# Patient Record
Sex: Male | Born: 1965 | Race: Black or African American | Hispanic: No | Marital: Single | Smoking: Former smoker
Health system: Southern US, Community
[De-identification: ages and names within clinical notes are randomized; demographics above are authoritative.]

## PROBLEM LIST (undated history)

## (undated) DIAGNOSIS — I509 Heart failure, unspecified: Secondary | ICD-10-CM

## (undated) DIAGNOSIS — G4730 Sleep apnea, unspecified: Secondary | ICD-10-CM

## (undated) HISTORY — PX: THYROID SURGERY: SHX805

## (undated) HISTORY — DX: Sleep apnea, unspecified: G47.30

## (undated) HISTORY — PX: HERNIA REPAIR: SHX51

## (undated) HISTORY — PX: HEAD & NECK WOUND REPAIR / CLOSURE: SUR1142

---
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
## 2014-01-23 DIAGNOSIS — I509 Heart failure, unspecified: Secondary | ICD-10-CM

## 2014-01-23 HISTORY — DX: Heart failure, unspecified: I50.9

## 2019-03-24 DIAGNOSIS — K859 Acute pancreatitis without necrosis or infection, unspecified: Secondary | ICD-10-CM

## 2019-03-24 HISTORY — DX: Acute pancreatitis without necrosis or infection, unspecified: K85.90

## 2019-04-03 ENCOUNTER — Inpatient Hospital Stay (HOSPITAL_COMMUNITY)
Admission: EM | Admit: 2019-04-03 | Discharge: 2019-04-10 | DRG: 439 | Disposition: A | Discharge status: Home / Self Care | Payer: BLUE CROSS/BLUE SHIELD | Attending: Internal Medicine | Admitting: Internal Medicine

## 2019-04-03 ENCOUNTER — Inpatient Hospital Stay (HOSPITAL_COMMUNITY): Payer: BLUE CROSS/BLUE SHIELD

## 2019-04-03 ENCOUNTER — Encounter (HOSPITAL_COMMUNITY): Payer: Self-pay

## 2019-04-03 ENCOUNTER — Emergency Department (HOSPITAL_COMMUNITY): Payer: BLUE CROSS/BLUE SHIELD

## 2019-04-03 ENCOUNTER — Other Ambulatory Visit: Payer: Self-pay

## 2019-04-03 DIAGNOSIS — K7460 Unspecified cirrhosis of liver: Secondary | ICD-10-CM | POA: Diagnosis present

## 2019-04-03 DIAGNOSIS — N182 Chronic kidney disease, stage 2 (mild): Secondary | ICD-10-CM | POA: Diagnosis present

## 2019-04-03 DIAGNOSIS — N179 Acute kidney failure, unspecified: Secondary | ICD-10-CM | POA: Diagnosis present

## 2019-04-03 DIAGNOSIS — I440 Atrioventricular block, first degree: Secondary | ICD-10-CM | POA: Diagnosis present

## 2019-04-03 DIAGNOSIS — E669 Obesity, unspecified: Secondary | ICD-10-CM

## 2019-04-03 DIAGNOSIS — I10 Essential (primary) hypertension: Secondary | ICD-10-CM | POA: Diagnosis not present

## 2019-04-03 DIAGNOSIS — K219 Gastro-esophageal reflux disease without esophagitis: Secondary | ICD-10-CM | POA: Diagnosis present

## 2019-04-03 DIAGNOSIS — I452 Bifascicular block: Secondary | ICD-10-CM | POA: Diagnosis present

## 2019-04-03 DIAGNOSIS — N1330 Unspecified hydronephrosis: Secondary | ICD-10-CM | POA: Diagnosis present

## 2019-04-03 DIAGNOSIS — I5032 Chronic diastolic (congestive) heart failure: Secondary | ICD-10-CM | POA: Diagnosis present

## 2019-04-03 DIAGNOSIS — Z87891 Personal history of nicotine dependence: Secondary | ICD-10-CM

## 2019-04-03 DIAGNOSIS — R0600 Dyspnea, unspecified: Secondary | ICD-10-CM

## 2019-04-03 DIAGNOSIS — K859 Acute pancreatitis without necrosis or infection, unspecified: Secondary | ICD-10-CM | POA: Diagnosis not present

## 2019-04-03 DIAGNOSIS — Z20822 Contact with and (suspected) exposure to covid-19: Secondary | ICD-10-CM | POA: Diagnosis present

## 2019-04-03 DIAGNOSIS — Z6832 Body mass index (BMI) 32.0-32.9, adult: Secondary | ICD-10-CM | POA: Diagnosis not present

## 2019-04-03 DIAGNOSIS — K567 Ileus, unspecified: Secondary | ICD-10-CM | POA: Diagnosis not present

## 2019-04-03 DIAGNOSIS — K831 Obstruction of bile duct: Secondary | ICD-10-CM | POA: Insufficient documentation

## 2019-04-03 DIAGNOSIS — R1011 Right upper quadrant pain: Secondary | ICD-10-CM | POA: Diagnosis present

## 2019-04-03 DIAGNOSIS — I130 Hypertensive heart and chronic kidney disease with heart failure and stage 1 through stage 4 chronic kidney disease, or unspecified chronic kidney disease: Secondary | ICD-10-CM | POA: Diagnosis present

## 2019-04-03 DIAGNOSIS — K851 Biliary acute pancreatitis without necrosis or infection: Secondary | ICD-10-CM | POA: Diagnosis not present

## 2019-04-03 DIAGNOSIS — R140 Abdominal distension (gaseous): Secondary | ICD-10-CM

## 2019-04-03 LAB — COMPREHENSIVE METABOLIC PANEL
ALT: 920 U/L — ABNORMAL HIGH (ref 0–44)
AST: 1168 U/L — ABNORMAL HIGH (ref 15–41)
Albumin: 4.1 g/dL (ref 3.5–5.0)
Alkaline Phosphatase: 384 U/L — ABNORMAL HIGH (ref 38–126)
Anion gap: 11 (ref 5–15)
BUN: 13 mg/dL (ref 6–20)
CO2: 26 mmol/L (ref 22–32)
Calcium: 10.1 mg/dL (ref 8.9–10.3)
Chloride: 103 mmol/L (ref 98–111)
Creatinine, Ser: 1.81 mg/dL — ABNORMAL HIGH (ref 0.61–1.24)
GFR calc Af Amer: 48 mL/min — ABNORMAL LOW (ref >60)
GFR calc non Af Amer: 42 mL/min — ABNORMAL LOW (ref >60)
Glucose, Bld: 174 mg/dL — ABNORMAL HIGH (ref 70–99)
Potassium: 4 mmol/L (ref 3.5–5.1)
Sodium: 140 mmol/L (ref 135–145)
Total Bilirubin: 3.8 mg/dL — ABNORMAL HIGH (ref 0.3–1.2)
Total Protein: 8.3 g/dL — ABNORMAL HIGH (ref 6.5–8.1)

## 2019-04-03 LAB — URINALYSIS, ROUTINE W REFLEX MICROSCOPIC
Bacteria, UA: NONE SEEN
Bilirubin Urine: NEGATIVE
Glucose, UA: NEGATIVE mg/dL
Hgb urine dipstick: NEGATIVE
Ketones, ur: NEGATIVE mg/dL
Leukocytes,Ua: NEGATIVE
Nitrite: NEGATIVE
Protein, ur: 30 mg/dL — AB
Specific Gravity, Urine: 1.01 (ref 1.005–1.030)
pH: 6 (ref 5.0–8.0)

## 2019-04-03 LAB — CBC
HCT: 52.8 % — ABNORMAL HIGH (ref 39.0–52.0)
Hemoglobin: 17.4 g/dL — ABNORMAL HIGH (ref 13.0–17.0)
MCH: 31.1 pg (ref 26.0–34.0)
MCHC: 33 g/dL (ref 30.0–36.0)
MCV: 94.3 fL (ref 80.0–100.0)
Platelets: 244 10*3/uL (ref 150–400)
RBC: 5.6 MIL/uL (ref 4.22–5.81)
RDW: 13.4 % (ref 11.5–15.5)
WBC: 9.7 10*3/uL (ref 4.0–10.5)
nRBC: 0 % (ref 0.0–0.2)

## 2019-04-03 LAB — LIPASE, BLOOD: Lipase: 6430 U/L — ABNORMAL HIGH (ref 11–51)

## 2019-04-03 LAB — HEPATITIS A ANTIBODY, IGM: Hep A IgM: NONREACTIVE

## 2019-04-03 LAB — ETHANOL: Alcohol, Ethyl (B): <10 mg/dL (ref <10)

## 2019-04-03 LAB — HIV ANTIBODY (ROUTINE TESTING W REFLEX): HIV Screen 4th Generation wRfx: NONREACTIVE

## 2019-04-03 LAB — HEPATITIS PANEL, ACUTE
HCV Ab: NONREACTIVE
Hep A IgM: NONREACTIVE
Hep B C IgM: NONREACTIVE
Hepatitis B Surface Ag: NONREACTIVE

## 2019-04-03 LAB — SARS CORONAVIRUS 2 (TAT 6-24 HRS): SARS Coronavirus 2: NEGATIVE

## 2019-04-03 LAB — GAMMA GT: GGT: 1145 U/L — ABNORMAL HIGH (ref 7–50)

## 2019-04-03 LAB — HEPATITIS B SURFACE ANTIGEN: Hepatitis B Surface Ag: NONREACTIVE

## 2019-04-03 LAB — PROTIME-INR
INR: 1 (ref 0.8–1.2)
Prothrombin Time: 13.1 seconds (ref 11.4–15.2)

## 2019-04-03 LAB — HEPATITIS B CORE ANTIBODY, IGM: Hep B C IgM: NONREACTIVE

## 2019-04-03 LAB — TROPONIN I (HIGH SENSITIVITY): Troponin I (High Sensitivity): 6 ng/L (ref <18)

## 2019-04-03 LAB — FERRITIN: Ferritin: 260 ng/mL (ref 24–336)

## 2019-04-03 LAB — ACETAMINOPHEN LEVEL: Acetaminophen (Tylenol), Serum: <10 ug/mL — ABNORMAL LOW (ref 10–30)

## 2019-04-03 LAB — LACTATE DEHYDROGENASE: LDH: 748 U/L — ABNORMAL HIGH (ref 98–192)

## 2019-04-03 MED ORDER — SODIUM CHLORIDE 0.9 % IV BOLUS
1000.0000 mL | Freq: Once | INTRAVENOUS | Status: AC
  Administered 2019-04-03: 1000 mL via INTRAVENOUS

## 2019-04-03 MED ORDER — SODIUM CHLORIDE 0.9 % IV BOLUS: 500.0000 mL | Freq: Once | INTRAVENOUS | Status: DC

## 2019-04-03 MED ORDER — PIPERACILLIN-TAZOBACTAM 3.375 G IVPB
3.3750 g | Freq: Three times a day (TID) | INTRAVENOUS | Status: DC
  Administered 2019-04-04: 3.375 g via INTRAVENOUS
  Filled 2019-04-03: qty 50

## 2019-04-03 MED ORDER — ACETAMINOPHEN 325 MG PO TABS: 650.0000 mg | ORAL_TABLET | Freq: Four times a day (QID) | ORAL | Status: DC | PRN

## 2019-04-03 MED ORDER — ACETAMINOPHEN 650 MG RE SUPP: 650.0000 mg | Freq: Four times a day (QID) | RECTAL | Status: DC | PRN

## 2019-04-03 MED ORDER — LACTATED RINGERS IV BOLUS
1000.0000 mL | Freq: Once | INTRAVENOUS | Status: AC
  Administered 2019-04-03 (×2): 1000 mL via INTRAVENOUS

## 2019-04-03 MED ORDER — ONDANSETRON 4 MG PO TBDP
4.0000 mg | ORAL_TABLET | Freq: Once | ORAL | Status: AC | PRN
  Administered 2019-04-03: 4 mg via ORAL
  Filled 2019-04-03: qty 1

## 2019-04-03 MED ORDER — PIPERACILLIN-TAZOBACTAM 3.375 G IVPB 30 MIN
3.3750 g | Freq: Once | INTRAVENOUS | Status: AC
  Administered 2019-04-03: 3.375 g via INTRAVENOUS
  Filled 2019-04-03: qty 50

## 2019-04-03 MED ORDER — HYDROMORPHONE HCL 1 MG/ML IJ SOLN
0.5000 mg | INTRAMUSCULAR | Status: DC | PRN
  Administered 2019-04-03 – 2019-04-05 (×10): 0.5 mg via INTRAVENOUS
  Filled 2019-04-03 (×12): qty 1

## 2019-04-03 MED ORDER — LABETALOL HCL 5 MG/ML IV SOLN
10.0000 mg | INTRAVENOUS | Status: DC | PRN
  Filled 2019-04-03: qty 4

## 2019-04-03 MED ORDER — ONDANSETRON HCL 4 MG/2ML IJ SOLN
4.0000 mg | Freq: Once | INTRAMUSCULAR | Status: AC
  Administered 2019-04-03: 4 mg via INTRAVENOUS
  Filled 2019-04-03: qty 2

## 2019-04-03 MED ORDER — MORPHINE SULFATE (PF) 4 MG/ML IV SOLN
4.0000 mg | Freq: Once | INTRAVENOUS | Status: AC
  Administered 2019-04-03: 4 mg via INTRAVENOUS
  Filled 2019-04-03: qty 1

## 2019-04-03 MED ORDER — ONDANSETRON 4 MG PO TBDP: 8.0000 mg | ORAL_TABLET | Freq: Once | ORAL | Status: DC

## 2019-04-03 MED ORDER — ONDANSETRON HCL 4 MG PO TABS: 4.0000 mg | ORAL_TABLET | Freq: Four times a day (QID) | ORAL | Status: DC | PRN

## 2019-04-03 MED ORDER — PIPERACILLIN-TAZOBACTAM 3.375 G IVPB 30 MIN: 3.3750 g | Freq: Once | INTRAVENOUS | Status: DC

## 2019-04-03 MED ORDER — FAMOTIDINE 20 MG PO TABS: 20.0000 mg | ORAL_TABLET | Freq: Two times a day (BID) | ORAL | Status: DC | PRN

## 2019-04-03 MED ORDER — ONDANSETRON HCL 4 MG/2ML IJ SOLN: 4.0000 mg | Freq: Four times a day (QID) | INTRAMUSCULAR | Status: DC | PRN

## 2019-04-03 MED ORDER — LACTATED RINGERS IV BOLUS
1000.0000 mL | Freq: Once | INTRAVENOUS | Status: AC
  Administered 2019-04-03: 1000 mL via INTRAVENOUS

## 2019-04-03 MED ORDER — SODIUM CHLORIDE 0.9 % IV SOLN: INTRAVENOUS | Status: DC

## 2019-04-03 MED ORDER — PANTOPRAZOLE SODIUM 40 MG IV SOLR
40.0000 mg | Freq: Two times a day (BID) | INTRAVENOUS | Status: DC
  Administered 2019-04-03 – 2019-04-07 (×8): 40 mg via INTRAVENOUS
  Filled 2019-04-03 (×9): qty 40

## 2019-04-03 MED ORDER — LORAZEPAM 0.5 MG PO TABS
0.5000 mg | ORAL_TABLET | Freq: Four times a day (QID) | ORAL | Status: DC | PRN
  Administered 2019-04-03: 0.5 mg via ORAL
  Filled 2019-04-03: qty 1

## 2019-04-03 NOTE — ED Notes (Signed)
Pt transported to MRI 

## 2019-04-03 NOTE — H&P (Addendum)
History and Physical    Todd Buchanan JSE:[id number] DOB: 08-31-1965 DOA: 04/03/2019  PCP: Patient, No Pcp Per   Patient coming from: Home  I have personally briefly reviewed patient's old medical records in [place]  Chief Complaint: Abd pain  HPI: Todd Buchanan is a 54 y.o. male with medical history significant of remote Hx of CHF (no longer needing medications) presented with on and off abdominal pain.   Symptoms started about 2 months ago, associated with meals, time also had pain at night.   Denied any feeling of nauseous vomiting, diarrhea, no weight loss.   Given to see a local GI specialist in [facility], symptomatic gallstone was suspected, and he was told to return the hospital if he had any worsening of his symptoms.  Last night patient had a severe episode of abdominal pain, cramping like a 10 out of 10, with 1 episode of NBNB vomiting.  Then he has vomited multiple times over night and this morning.    Also mentioned that his stool color has been lighter since last month, and urine color has turned darker.  He started to feel episode of chills this morning.  Denied any chest pain, no short of breath.    ED Course: AST 1168, ALT 920, Alk phos 384, tot bili 3.8 as well as elevated creatinine to 1.8, RUQ U/S showed no significant obstruction or gallstones.  Review of Systems: As per HPI otherwise 10 point review of systems negative.    Past Medical History:  Diagnosis Date  . CHF (congestive heart failure) ([place])    reports lost weight and resolved and was taken off meds    History reviewed. No pertinent surgical history.   reports that he has quit smoking. He has a 10.00 pack-year smoking history. He does not have any smokeless tobacco history on file. He reports previous alcohol use. He reports previous drug use. Drugs: Cocaine and Marijuana.  No Known Allergies  No family history on file.   Prior to Admission medications   Medication  Sig Start Date End Date Taking? Authorizing Provider  Ascorbic Acid (VITAMIN C PO) Take 1 tablet by mouth daily.   Yes [provider]  Famotidine (PEPCID PO) Take 1 tablet by mouth 2 (two) times daily as needed (heartburn).   Yes [provider]    Physical Exam: Vitals:   04/03/19 1234 04/03/19 1400 04/03/19 1430 04/03/19 1445  BP: (!) 149/112 (!) 168/113 (!) 175/111 (!) 183/119  Pulse: 87 92 87 85  Resp: (!) 24 (!) 22 (!) 23 (!) 23  Temp:      TempSrc:      SpO2: 94% 90% 92% 92%  Weight:      Height:        Constitutional: NAD, calm, comfortable Vitals:   04/03/19 1234 04/03/19 1400 04/03/19 1430 04/03/19 1445  BP: (!) 149/112 (!) 168/113 (!) 175/111 (!) 183/119  Pulse: 87 92 87 85  Resp: (!) 24 (!) 22 (!) 23 (!) 23  Temp:      TempSrc:      SpO2: 94% 90% 92% 92%  Weight:      Height:       Eyes: PERRL, positive for jaundice ENMT: Mucous membranes are dry. Posterior pharynx clear of any exudate or lesions.Normal dentition.  Neck: normal, supple, no masses, no thyromegaly Respiratory: clear to auscultation bilaterally, no wheezing, no crackles. Normal respiratory effort. No accessory muscle use.  Cardiovascular: Regular rate and rhythm, no murmurs /  rubs / gallops. No extremity edema. 2+ pedal pulses. No carotid bruits.  Abdomen: RUQ tenderness, no rebound no guarding, Murphy sign negative. Musculoskeletal: no clubbing / cyanosis. No joint deformity upper and lower extremities. Good ROM, no contractures. Normal muscle tone.  Skin: no rashes, lesions, ulcers. No induration Neurologic: CN 2-12 grossly intact. Sensation intact, DTR normal. Strength 5/5 in all 4.  Psychiatric: Normal judgment and insight. Alert and oriented x 3. Normal mood.     Labs on Admission: I have personally reviewed following labs and imaging studies  CBC: Recent Labs  Lab 04/03/19 1041  WBC 9.7  HGB 17.4*  HCT 52.8*  MCV 94.3  PLT 818   Basic Metabolic Panel: Recent  Labs  Lab 04/03/19 1041  NA 140  K 4.0  CL 103  CO2 26  GLUCOSE 174*  BUN 13  CREATININE 1.81*  CALCIUM 10.1   GFR: Estimated Creatinine Clearance: 56.3 mL/min (A) (by C-G formula based on SCr of 1.81 mg/dL (H)). Liver Function Tests: Recent Labs  Lab 04/03/19 1041  AST 1,168*  ALT 920*  ALKPHOS 384*  BILITOT 3.8*  PROT 8.3*  ALBUMIN 4.1   Recent Labs  Lab 04/03/19 1041  LIPASE 6,430*   No results for input(s): AMMONIA in the last 168 hours. Coagulation Profile: No results for input(s): INR, PROTIME in the last 168 hours. Cardiac Enzymes: No results for input(s): CKTOTAL, CKMB, CKMBINDEX, TROPONINI in the last 168 hours. BNP (last 3 results) No results for input(s): PROBNP in the last 8760 hours. HbA1C: No results for input(s): HGBA1C in the last 72 hours. CBG: No results for input(s): GLUCAP in the last 168 hours. Lipid Profile: No results for input(s): CHOL, HDL, LDLCALC, TRIG, CHOLHDL, LDLDIRECT in the last 72 hours. Thyroid Function Tests: No results for input(s): TSH, T4TOTAL, FREET4, T3FREE, THYROIDAB in the last 72 hours. Anemia Panel: No results for input(s): VITAMINB12, FOLATE, FERRITIN, TIBC, IRON, RETICCTPCT in the last 72 hours. Urine analysis:    Component Value Date/Time   COLORURINE AMBER (A) 04/03/2019 1322   APPEARANCEUR CLEAR 04/03/2019 1322   LABSPEC 1.010 04/03/2019 1322   PHURINE 6.0 04/03/2019 1322   GLUCOSEU NEGATIVE 04/03/2019 1322   HGBUR NEGATIVE 04/03/2019 1322   BILIRUBINUR NEGATIVE 04/03/2019 1322   KETONESUR NEGATIVE 04/03/2019 1322   PROTEINUR 30 (A) 04/03/2019 1322   NITRITE NEGATIVE 04/03/2019 1322   LEUKOCYTESUR NEGATIVE 04/03/2019 1322    Radiological Exams on Admission: US Abdomen Complete  Result Date: 04/03/2019 CLINICAL DATA:  Elevated liver function tests, elevated lipase, abdominal pain. EXAM: ABDOMEN ULTRASOUND COMPLETE COMPARISON:  None. FINDINGS: Gallbladder: Decompressed. No gallstones or wall thickening  visualized. No sonographic Murphy sign noted by sonographer. Common bile duct: Diameter: 7 mm, upper normal for age. Liver: No focal lesion identified. Coarse echotexture. Portal vein is patent on color Doppler imaging with normal direction of blood flow towards the liver. IVC: Not visualized. Pancreas: Not visualized due to overlying bowel gas. Spleen: Size and appearance within normal limits. Right Kidney: Length: 9.9 cm. Thin cortex. Echogenicity within normal limits. No mass or hydronephrosis visualized. Left Kidney: Length: 11.7 cm. Thin cortex. Echogenicity within normal limits. Mild hydronephrosis. No mass visualized. Abdominal aorta: Not visualized due to overlying bowel gas. Other findings: None. Study limited by body habitus and a significant amount of bowel gas. IMPRESSION: 1. No gallstones identified.  No evidence of acute cholecystitis. 2. Liver with coarse echotexture raising the possibility of underlying cirrhosis. No focal liver lesion identified. 3. Common bile duct  upper limits of normal in caliber at 7 mm diameter. No intrahepatic bile duct dilatation appreciated. 4. Mild LEFT renal hydronephrosis, of uncertain chronicity. 5. Both kidneys with thin cortices suggesting some degree of chronic medical renal disease and/or renal artery atherosclerosis. 6. Study limited by body habitus and a significant amount of bowel gas. Pancreas and abdominal aorta not visualized due to overlying bowel gas. Electronically Signed   By: Franki Cabot M.D.   On: 04/03/2019 15:58    EKG: Independently reviewed.   Assessment/Plan Active Problems:   Pancreatitis due to biliary obstruction   Acute pancreatitis  Acute pancreatitis No gallstones identified, discussed with GI attending Dr. Megan Salon, MRCP ordered Pain meds and IVF, NPO Due to AKI, CT abd not done. May need ERCP D/W on call Surgeon Dr. Redmond Pulling, who plans to see the patient tonight.  Probably early cholangitis Given fast deterioration of his  symptoms, will cover with Zosyn for now.  Tansaminitis GI plans for further workup, he only has a remote Hx of EtOH use Will send AFP too  AKI vs CKD Weekly patient has severe dehydration, with hemoconcentration, will give IV fluid, and received a total of 3 L IV boluses in the ED.  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   DVT prophylaxis: SCD Code Status: Full Family Communication: None Disposition Plan: May need surgical chole before discharge Consults called: Dr. Megan Salon from GI Admission status: Med surg   Lequita Halt MD [facility] Pager [phone]    04/03/2019, 5:03 PM

## 2019-04-03 NOTE — ED Triage Notes (Signed)
Pt reports 2 weeks of RUQ pain, saw a GI specialist in [place] 2 weeks ago who thinks it is related to his gallbladder. Pt reports 2 episodes of emesis this morning. Pt a.o, nad noted.

## 2019-04-03 NOTE — ED Provider Notes (Addendum)
[facility] EMERGENCY DEPARTMENT Provider Note   CSN: [id number] Arrival date & time: 04/03/19  1007    History Chief Complaint  Patient presents with  . Abdominal Pain  . Emesis    Todd Buchanan is a 54 y.o. male with PMH CHF (no longer needing medications) here with abdominal pain and vomiting. Patient reports that 2 months ago he reported to his doctor with intermittent RUQ pain.  He was evaluated by a GI specialist in [place] who believed that this was due to his gallbladder. An outpatient RUQ [place] was ordered but was not scheduled until 3/17 and he was told to return the hospital if he had any worsening of his symptoms.  Last night patient had acutely worsening abdominal pain that spread from his right upper quadrant to his epigastrium and into his right lower quadrant.  He also developed NBNB vomiting. States he has vomited multiple times over night and this morning.  He denies any new fevers, chills, headache, vision changes, dizziness. He denies any blood in stool, dark stools, diarrhea.   Patient reports that he last drank about 20 years ago which was when he also did some cocaine, marijuana and smoked cigarettes, but he has never done IV drugs and stopped doing all those things 20 years ago. He states he was previously told he had CHF ad was on many medications, but he lost a large amount of weight and was taken off his medications. He does not currently have any medications he takes regularly at home.   Past Medical History:  Diagnosis Date  . CHF (congestive heart failure) ([place])    reports lost weight and resolved and was taken off meds    There are no problems to display for this patient.   History reviewed. No pertinent surgical history.    No family history on file.  Social History   Tobacco Use  . Smoking status: Former Smoker    Packs/day: 1.00    Years: 10.00    Pack years: 10.00  . Tobacco comment: quit >20 years ago  Substance  Use Topics  . Alcohol use: Not Currently    Comment: last drank 20 years ago  . Drug use: Not Currently    Types: Cocaine, Marijuana    Comment: last used >20 years ago    Home Medications Prior to Admission medications   Medication Sig Start Date End Date Taking? Authorizing Provider  Ascorbic Acid (VITAMIN C PO) Take 1 tablet by mouth daily.   Yes [provider]  Famotidine (PEPCID PO) Take 1 tablet by mouth 2 (two) times daily as needed (heartburn).   Yes [provider]    Allergies    Patient has no known allergies.  Review of Systems   Review of Systems  Constitutional: Negative for chills and fever.  Eyes: Negative for visual disturbance.  Respiratory: Negative for chest tightness and shortness of breath.   Cardiovascular: Negative for chest pain, palpitations and leg swelling.  Gastrointestinal: Positive for abdominal distention, abdominal pain, constipation, nausea and vomiting. Negative for blood in stool and diarrhea.  Neurological: Negative for dizziness, light-headedness and headaches.    Physical Exam Updated Vital Signs BP (!) 149/112   Pulse 87   Temp 98.3 F (36.8 C) (Oral)   Resp (!) 24   Ht '5\' 10"'  (1.778 m)   Wt 101.2 kg   SpO2 94%   BMI 32.00 kg/m   Physical Exam Vitals and nursing note reviewed.  Constitutional:  General: He is not in acute distress.    Appearance: He is well-developed and normal weight. He is ill-appearing.  HENT:     Head: Normocephalic and atraumatic.     Mouth/Throat:     Mouth: Mucous membranes are moist.  Cardiovascular:     Rate and Rhythm: Normal rate and regular rhythm.     Heart sounds: Normal heart sounds.  Pulmonary:     Effort: Pulmonary effort is normal.     Breath sounds: Normal breath sounds.  Abdominal:     General: Abdomen is flat. Bowel sounds are normal. There is distension.     Palpations: Abdomen is soft. There is no fluid wave.     Tenderness: There is abdominal tenderness in  the right upper quadrant, epigastric area and left lower quadrant. There is no guarding. Positive signs include Murphy's sign.  Skin:    General: Skin is warm.     Capillary Refill: Capillary refill takes less than 2 seconds.  Neurological:     General: No focal deficit present.     Mental Status: He is alert and oriented to person, place, and time.  Psychiatric:        Mood and Affect: Mood normal.        Behavior: Behavior normal.     ED Results / Procedures / Treatments   Labs (all labs ordered are listed, but only abnormal results are displayed) Labs Reviewed  LIPASE, BLOOD - Abnormal; Notable for the following components:      Result Value   Lipase 6,430 (*)    All other components within normal limits  COMPREHENSIVE METABOLIC PANEL - Abnormal; Notable for the following components:   Glucose, Bld 174 (*)    Creatinine, Ser 1.81 (*)    Total Protein 8.3 (*)    AST 1,168 (*)    ALT 920 (*)    Alkaline Phosphatase 384 (*)    Total Bilirubin 3.8 (*)    GFR calc non Af Amer 42 (*)    GFR calc Af Amer 48 (*)    All other components within normal limits  CBC - Abnormal; Notable for the following components:   Hemoglobin 17.4 (*)    HCT 52.8 (*)    All other components within normal limits  URINALYSIS, ROUTINE W REFLEX MICROSCOPIC  HEPATITIS PANEL, ACUTE  ACETAMINOPHEN LEVEL  ETHANOL  GAMMA GT  LACTATE DEHYDROGENASE    EKG EKG Interpretation  Date/Time:  Thursday April 03 2019 12:45:00 EST Ventricular Rate:  86 PR Interval:    QRS Duration: 110 QT Interval:  364 QTC Calculation: 436 R Axis:   -72 Text Interpretation: Sinus rhythm Prolonged PR interval Incomplete RBBB and LAFB Low voltage, precordial leads Right ventricular hypertrophy ST elevation suggests acute pericarditis No significant change since last tracing Confirmed by Wandra Arthurs [phone]) on 04/03/2019 1:08:22 PM   Radiology No results found.  Procedures Procedures (including critical care  time)  Medications Ordered in ED Medications  sodium chloride 0.9 % bolus 1,000 mL (has no administration in time range)  ondansetron (ZOFRAN-ODT) disintegrating tablet 4 mg (4 mg Oral Given 04/03/19 1019)  sodium chloride 0.9 % bolus 1,000 mL (1,000 mLs Intravenous New Bag/Given 04/03/19 1237)  ondansetron (ZOFRAN) injection 4 mg (4 mg Intravenous Given 04/03/19 1246)  morphine 4 MG/ML injection 4 mg (4 mg Intravenous Given 04/03/19 1300)    ED Course  I have reviewed the triage vital signs and the nursing notes.  Pertinent labs & imaging results  that were available during my care of the patient were reviewed by me and considered in my medical decision making (see chart for details).    MDM Rules/Calculators/A&P                      54 yo male here with abdominal pain and vomiting. RUQ and epigastric abdominal pain acutely worsened overnight and patient developed vomiting which brought him in. He has outpatient RUQ [place] scheduled for 3/18 to evaluated or gallbladder etiology. CMP here reveals AST 1168, ALT 920, Alk phos 384, tot bili 3.8 as well as elevated creatinine to 1.8. Will give 1L bolus and zofran IV. Patient denies any recent hx of alcohol use. Concern for choledocholithiasis on exam and will order RUQ [place] along with GGT, LDH, hepatitis panel, tylenol and EtOH levels.   Lipase returned elevated to 6,430 suggestive of acute pancreatitis. Would like to order abdominal CT with contrast, but given significant AKI, will order another 1L bolus and change [place] to complete abdominal US. Patient given morphine for pain. LDH 748, GGT 1145. [place] with no signs of gallstones or cholecystitis; likely does not need surgery consult at this time. Patient may benefit from further abdominal imaging when kidney function improves. Spoke with hospitalist team who will admit the patient for elevated LFT's, pancreatitis and AKI.   Todd Buchanan was evaluated in Emergency Department on 04/03/2019 for the  symptoms described in the history of present illness. He was evaluated in the context of the global COVID-19 pandemic, which necessitated consideration that the patient might be at risk for infection with the SARS-CoV-2 virus that causes COVID-19. Institutional protocols and algorithms that pertain to the evaluation of patients at risk for COVID-19 are in a state of rapid change based on information released by regulatory bodies including the CDC and federal and state organizations. These policies and algorithms were followed during the patient's care in the ED. Final Clinical Impression(s) / ED Diagnoses Final diagnoses:  RUQ abdominal pain    Rx / DC Orders ED Discharge Orders    None       Francess Mullen, [place], DO 04/03/19 Bernie, [place], DO 04/03/19 1602    Drenda Freeze, MD 04/04/19 [phone]

## 2019-04-03 NOTE — Consult Note (Signed)
[facility] Consult  Referring Provider: Dr. Pearletha Forge Zhang/[facility] Primary Care Physician:  Patient, No Pcp Per Primary Gastroenterologist: Althia Forts  Reason for Consultation: Suspected biliary pancreatitis  HPI: Kelli Egolf is a 54 y.o. male with past medical history of heart failure (reported to resolve after weight loss, no longer requiring any medications) presenting with severe epigastric and right upper quadrant pain.  Patient reports he has had intermittent right upper quadrant pain for the past few months.  He was evaluated in [place] in the right upper quadrant ultrasound was ordered but not yet completed.  Due to acute worsening of pain, patient presented to the emergency department.  He stated he started having severe pain last night and it continued into this morning.  His pain is currently a 3 out of 10 after receiving pain medication.  He also had associated nausea and vomiting.  He often has pain in the right upper quadrant, but it is present in both the right upper quadrant and epigastrium today.    He notes 1 episode of light-colored loose stool yesterday, as well as one black stool approximately 1 month ago.  He notes he has lost about 10 to 20 pounds simply due to increased abdominal pain with eating.  He notes that fried and fatty foods tend to worsen his symptoms.  He reports intermittent heartburn, often taking Pepcid "like candy."  Denies dysphagia, chest pain, shortness of breath.  RUQ [place] today did not reveal any gallstones or acute cholecystitis.  CBD 7 mm, no intrahepatic bile duct dilation.  Liver with coarse echotexture, possible underlying cirrhosis.  No focal liver lesion identified. Both kidneys with thin cortices suggesting some degree of chronic medical renal disease and/or renal artery atherosclerosis.  Patient states he has not had any alcohol for the past 20 years.  However prior to that, he drank heavily for approximately 10  years.  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  He denies any history of IV drug use.  He occasionally takes Tylenol but typically does not exceed 2 pills/day.  He denies NSAID use.  He has never had an EGD, but he notes he had a colonoscopy 2 to 3 years ago, and he reports it was normal, without any polyps.   Past Medical History:  Diagnosis Date  . CHF (congestive heart failure) ([place])    reports lost weight and resolved and was taken off meds    History reviewed. No pertinent surgical history.  Prior to Admission medications   Medication Sig Start Date End Date Taking? Authorizing Provider  Ascorbic Acid (VITAMIN C PO) Take 1 tablet by mouth daily.   Yes [provider]  Famotidine (PEPCID PO) Take 1 tablet by mouth 2 (two) times daily as needed (heartburn).   Yes [provider]    Current Facility-Administered Medications  Medication Dose Route Frequency Provider Last Rate Last Admin  . 0.9 %  sodium chloride infusion   Intravenous Continuous Ronnette Juniper, MD      . acetaminophen (TYLENOL) tablet 650 mg  650 mg Oral Q6H PRN Lequita Halt, MD       Or  . acetaminophen (TYLENOL) suppository 650 mg  650 mg Rectal Q6H PRN Wynetta Fines T, MD      . HYDROmorphone (DILAUDID) injection 0.5 mg  0.5 mg Intravenous Q4H PRN Wynetta Fines T, MD      . labetalol (NORMODYNE) injection 10 mg  10 mg Intravenous Q4H PRN Wynetta Fines  T, MD      . lactated ringers bolus 1,000 mL  1,000 mL Intravenous Once Ronnette Juniper, MD      . lactated ringers bolus 1,000 mL  1,000 mL Intravenous Once Ronnette Juniper, MD      . LORazepam (ATIVAN) tablet 0.5 mg  0.5 mg Oral Q6H PRN Wynetta Fines T, MD   0.5 mg at 04/03/19 1633  . ondansetron (ZOFRAN) tablet 4 mg  4 mg Oral Q6H PRN Lequita Halt, MD       Or  . ondansetron [facility]) injection 4 mg  4 mg Intravenous Q6H PRN Wynetta Fines T, MD      . pantoprazole (PROTONIX) injection 40 mg  40 mg Intravenous Q12H Ronnette Juniper, MD      .  piperacillin-tazobactam (ZOSYN) IVPB 3.375 g  3.375 g Intravenous Once Duanne Limerick, RPH       Followed by  . piperacillin-tazobactam (ZOSYN) IVPB 3.375 g  3.375 g Intravenous Q8H Duanne Limerick, [facility]       Current Outpatient Medications  Medication Sig Dispense Refill  . Ascorbic Acid (VITAMIN C PO) Take 1 tablet by mouth daily.    . Famotidine (PEPCID PO) Take 1 tablet by mouth 2 (two) times daily as needed (heartburn).      Allergies as of 04/03/2019  . (No Known Allergies)    No family history on file.  Social History   Socioeconomic History  . Marital status: Single    Spouse name: Not on file  . Number of children: Not on file  . Years of education: Not on file  . Highest education level: Not on file  Occupational History  . Not on file  Tobacco Use  . Smoking status: Former Smoker    Packs/day: 1.00    Years: 10.00    Pack years: 10.00  . Tobacco comment: quit >20 years ago  Substance and Sexual Activity  . Alcohol use: Not Currently    Comment: last drank 20 years ago  . Drug use: Not Currently    Types: Cocaine, Marijuana    Comment: last used >20 years ago  . Sexual activity: Not on file  Other Topics Concern  . Not on file  Social History Narrative  . Not on file   Social Determinants of Health   Financial Resource Strain:   . Difficulty of Paying Living Expenses:   Food Insecurity:   . Worried About Charity fundraiser in the Last Year:   . Arboriculturist in the Last Year:   Transportation Needs:   . Film/video editor (Medical):   Marland Kitchen Lack of Transportation (Non-Medical):   Physical Activity:   . Days of Exercise per Week:   . Minutes of Exercise per Session:   Stress:   . Feeling of Stress :   Social Connections:   . Frequency of Communication with Friends and Family:   . Frequency of Social Gatherings with Friends and Family:   . Attends Religious Services:   . Active Member of Clubs or Organizations:   . Attends Archivist  Meetings:   Marland Kitchen Marital Status:   Intimate Partner Violence:   . Fear of Current or Ex-Partner:   . Emotionally Abused:   Marland Kitchen Physically Abused:   . Sexually Abused:     Review of Systems: As noted in HPI.  Physical Exam: Vital signs in last 24 hours: Temp:  [98.3 F (36.8 C)] 98.3 F (36.8 C) (03/11 1016) Pulse  Rate:  [85-117] 85 (03/11 1445) Resp:  [18-24] 23 (03/11 1445) BP: (149-183)/(111-119) 183/119 (03/11 1445) SpO2:  [90 %-96 %] 92 % (03/11 1445) Weight:  [101.2 kg] 101.2 kg (03/11 1018)    General:   Obese, lying in bed, appears ill. Head:  Normocephalic and atraumatic. Eyes:  Mild icterus.   Conjunctiva pink. Ears:  Normal auditory acuity. Nose:  No deformity, discharge,  or lesions. Lungs:  Clear throughout to auscultation.   No wheezes, crackles, or rhonchi. No acute distress. Heart:  Regular rate and rhythm; no murmurs, clicks, rubs,  or gallops. Extremities:  Without clubbing or edema. Neurologic:  Alert and  oriented x4;  grossly normal neurologically. Skin:  Intact without significant lesions or rashes. Psych:  Alert and cooperative. Normal mood and affect. Abdomen: Soft, mildly distended, with moderate tenderness to palpation of epigastrium and right upper quadrant with mild diffuse tenderness throughout the abdomen. No masses, hepatosplenomegaly or hernias noted. Normal bowel sounds, without guarding, and without rebound.         Lab Results: Recent Labs    04/03/19 1041  WBC 9.7  HGB 17.4*  HCT 52.8*  PLT 244   BMET Recent Labs    04/03/19 1041  NA 140  K 4.0  CL 103  CO2 26  GLUCOSE 174*  BUN 13  CREATININE 1.81*  CALCIUM 10.1   LFT Recent Labs    04/03/19 1041  PROT 8.3*  ALBUMIN 4.1  AST 1,168*  ALT 920*  ALKPHOS 384*  BILITOT 3.8*   PT/INR No results for input(s): LABPROT, INR in the last 72 hours.  Studies/Results: US Abdomen Complete  Result Date: 04/03/2019 CLINICAL DATA:  Elevated liver function tests, elevated  lipase, abdominal pain. EXAM: ABDOMEN ULTRASOUND COMPLETE COMPARISON:  None. FINDINGS: Gallbladder: Decompressed. No gallstones or wall thickening visualized. No sonographic Murphy sign noted by sonographer. Common bile duct: Diameter: 7 mm, upper normal for age. Liver: No focal lesion identified. Coarse echotexture. Portal vein is patent on color Doppler imaging with normal direction of blood flow towards the liver. IVC: Not visualized. Pancreas: Not visualized due to overlying bowel gas. Spleen: Size and appearance within normal limits. Right Kidney: Length: 9.9 cm. Thin cortex. Echogenicity within normal limits. No mass or hydronephrosis visualized. Left Kidney: Length: 11.7 cm. Thin cortex. Echogenicity within normal limits. Mild hydronephrosis. No mass visualized. Abdominal aorta: Not visualized due to overlying bowel gas. Other findings: None. Study limited by body habitus and a significant amount of bowel gas. IMPRESSION: 1. No gallstones identified.  No evidence of acute cholecystitis. 2. Liver with coarse echotexture raising the possibility of underlying cirrhosis. No focal liver lesion identified. 3. Common bile duct upper limits of normal in caliber at 7 mm diameter. No intrahepatic bile duct dilatation appreciated. 4. Mild LEFT renal hydronephrosis, of uncertain chronicity. 5. Both kidneys with thin cortices suggesting some degree of chronic medical renal disease and/or renal artery atherosclerosis. 6. Study limited by body habitus and a significant amount of bowel gas. Pancreas and abdominal aorta not visualized due to overlying bowel gas. Electronically Signed   By: Franki Cabot M.D.   On: 04/03/2019 15:58    Impression: Pancreatitis, possibly biliary. Lipase 6430. Transaminitis: AST 1168, ALT 920, alk phos 384, T bili 3.8.  PT/INR pending.   GGT 1145. LDH 748.  BUN 13, creatinine 1.81, GFR 48.  Ethanol <10, Acetaminophen level < 10.  USG showed cirrhosis  Plan: Proceed with MRCP.   If positive for bile duct stones, will  proceed with ERCP.  Patient to remain n.p.o.  2000 mL LR bolus ordered, as well as 175 mL/h NS.  Acute hepatitis serologies, AMA, mitochondrial antibodies, ceruloplasmin, ANA, alpha-1 antitrypsin, ferritin ordered.  Adequate pain management.   LOS: 0 days   Ronnette Juniper, MD  04/03/2019, 5:10 PM

## 2019-04-04 ENCOUNTER — Encounter (HOSPITAL_COMMUNITY): Payer: Self-pay | Admitting: Internal Medicine

## 2019-04-04 DIAGNOSIS — R1011 Right upper quadrant pain: Secondary | ICD-10-CM

## 2019-04-04 LAB — CBC
HCT: 48.3 % (ref 39.0–52.0)
Hemoglobin: 15.8 g/dL (ref 13.0–17.0)
MCH: 31 pg (ref 26.0–34.0)
MCHC: 32.7 g/dL (ref 30.0–36.0)
MCV: 94.7 fL (ref 80.0–100.0)
Platelets: 217 10*3/uL (ref 150–400)
RBC: 5.1 MIL/uL (ref 4.22–5.81)
RDW: 13.8 % (ref 11.5–15.5)
WBC: 7.1 10*3/uL (ref 4.0–10.5)
nRBC: 0 % (ref 0.0–0.2)

## 2019-04-04 LAB — COMPREHENSIVE METABOLIC PANEL
ALT: 754 U/L — ABNORMAL HIGH (ref 0–44)
AST: 512 U/L — ABNORMAL HIGH (ref 15–41)
Albumin: 3.4 g/dL — ABNORMAL LOW (ref 3.5–5.0)
Alkaline Phosphatase: 344 U/L — ABNORMAL HIGH (ref 38–126)
Anion gap: 12 (ref 5–15)
BUN: 10 mg/dL (ref 6–20)
CO2: 26 mmol/L (ref 22–32)
Calcium: 8.9 mg/dL (ref 8.9–10.3)
Chloride: 106 mmol/L (ref 98–111)
Creatinine, Ser: 1.6 mg/dL — ABNORMAL HIGH (ref 0.61–1.24)
GFR calc Af Amer: 56 mL/min — ABNORMAL LOW (ref >60)
GFR calc non Af Amer: 48 mL/min — ABNORMAL LOW (ref >60)
Glucose, Bld: 106 mg/dL — ABNORMAL HIGH (ref 70–99)
Potassium: 4.3 mmol/L (ref 3.5–5.1)
Sodium: 144 mmol/L (ref 135–145)
Total Bilirubin: 2.1 mg/dL — ABNORMAL HIGH (ref 0.3–1.2)
Total Protein: 7.1 g/dL (ref 6.5–8.1)

## 2019-04-04 LAB — LIPID PANEL
Cholesterol: 246 mg/dL — ABNORMAL HIGH (ref 0–200)
HDL: 35 mg/dL — ABNORMAL LOW (ref >40)
LDL Cholesterol: 196 mg/dL — ABNORMAL HIGH (ref 0–99)
Total CHOL/HDL Ratio: 7 RATIO
Triglycerides: 73 mg/dL (ref <150)
VLDL: 15 mg/dL (ref 0–40)

## 2019-04-04 LAB — APTT: aPTT: 26 seconds (ref 24–36)

## 2019-04-04 LAB — PROTIME-INR
INR: 1.1 (ref 0.8–1.2)
Prothrombin Time: 13.7 seconds (ref 11.4–15.2)

## 2019-04-04 MED ORDER — SODIUM CHLORIDE 0.9 % IV SOLN: INTRAVENOUS | Status: DC

## 2019-04-04 MED ORDER — METOPROLOL TARTRATE 5 MG/5ML IV SOLN
5.0000 mg | INTRAVENOUS | Status: AC | PRN
  Administered 2019-04-04 – 2019-04-05 (×2): 5 mg via INTRAVENOUS
  Filled 2019-04-04 (×2): qty 5

## 2019-04-04 MED ORDER — AMLODIPINE BESYLATE 10 MG PO TABS
10.0000 mg | ORAL_TABLET | Freq: Every day | ORAL | Status: DC
  Administered 2019-04-04 – 2019-04-05 (×2): 10 mg via ORAL
  Filled 2019-04-04 (×2): qty 1

## 2019-04-04 NOTE — Consult Note (Signed)
CC: abdominal pain  Requesting provider: dr Roosevelt Locks  HPI: Todd Buchanan is an 54 y.o. male who is here for for worsening abdominal pain.  He reports a 31-monthhistory of intermittent upper abdominal pain with nausea, vomiting and abdominal bloating.  He states his symptoms generally last for solid 24 hours.  The first episode happened about 3 months ago and the pain was very intense.  He had multiple episodes of vomiting.  The second episode was not as severe with respect to the amount of emesis.  He will get hot and cold during these episodes.  It is mainly focused in his upper abdomen and a little bit to the left side.  He states that he has lost about 10 pounds over the past few months.  [facility] have [facility] problems and referred him for an outpatient ultrasound but is not able to be done until March 17.  He had a recurrent flare yesterday and it was the most intense one he had had.  He had upper abdominal pain, bloating nausea but no fever.  He has daily bowel movements.  He states now he is having lower abdominal and mid abdominal pain as well as the upper midline pain.  Not really having any right upper quadrant pain.  In the ER he was found to have a total bilirubin of 3.8, AST 02[birth year]02[birth year]1961, ALT 920, alk phos 380 and lipase of 6000.  He had an abdominal ultrasound that showed no evidence of gallstones but possible nodular liver.  He underwent an MRI last night which demonstrated pancreatitis without any evidence of cholecystitis, choledocholithiasis or biliary obstruction.  We were asked to see in consultation because of a possible biliary etiology  He does denies any recent drinking.  He has a remote history of drinking alcohol over 20 years ago along with smoking but no longer does that.  He denies any prescription medications.  He was told that he had high blood pressure and did lifestyle modification a few years  ago and lost weight and on follow-up he states that he was told his blood pressure was better and did not need medication.  He states that he may have a history of high cholesterol.  He states that he has had a prior hernia repair he thinks that he went through their bellybutton to fix the groin hernia  He only takes Echinacea over-the-counter.  Hepatitis panel so far is negative  Past Medical History:  Diagnosis Date  . CHF (congestive heart failure) ([facility]    reports lost weight and resolved and was taken off meds    History reviewed. No pertinent surgical history.  No family history on file.  Social:  reports that he has quit smoking. He has a 10.00 pack-year smoking history. He does not have any smokeless tobacco history on file. He reports previous alcohol use. He reports previous drug use. Drugs: Cocaine and Marijuana.  Allergies: No Known Allergies  Medications: I have reviewed the patient's current medications.  Results for orders placed or performed during the hospital encounter of 04/03/19 (from the past 48 hour(s))  Lipase, blood     Status: Abnormal   Collection Time: 04/03/19 10:41 AM  Result Value Ref Range   Lipase 6,430 (H) 11 - 51 U/L    Comment: RESULTS CONFIRMED BY MANUAL DILUTION Performed at [facility] [address], [place] [address]  Comprehensive  metabolic panel     Status: Abnormal   Collection Time: 04/03/19 10:41 AM  Result Value Ref Range   Sodium 140 135 - 145 mmol/L   Potassium 4.0 3.5 - 5.1 mmol/L   Chloride 103 98 - 111 mmol/L   CO2 26 22 - 32 mmol/L   Glucose, Bld 174 (H) 70 - 99 mg/dL    Comment: Glucose reference range applies only to samples taken after fasting for at least 8 hours.   BUN 13 6 - 20 mg/dL   Creatinine, Ser 1.81 (H) 0.61 - 1.24 mg/dL   Calcium 10.1 8.9 - 10.3 mg/dL   Total Protein 8.3 (H) 6.5 - 8.1 g/dL   Albumin 4.1 3.5 - 5.0 g/dL   AST 1,168 (H) 15 - 41 U/L   ALT 920 (H) 0 - 44 U/L   Alkaline  Phosphatase 384 (H) 38 - 126 U/L   Total Bilirubin 3.8 (H) 0.3 - 1.2 mg/dL   GFR calc non Af Amer 42 (L) >60 mL/min   GFR calc Af Amer 48 (L) >60 mL/min   Anion gap 11 5 - 15    Comment: Performed at [facility] [address], [place], [place] [address]  CBC     Status: Abnormal   Collection Time: 04/03/19 10:41 AM  Result Value Ref Range   WBC 9.7 4.0 - 10.5 K/uL   RBC 5.60 4.22 - 5.81 MIL/uL   Hemoglobin 17.4 (H) 13.0 - 17.0 g/dL   HCT 52.8 (H) 39.0 - 52.0 %   MCV 94.3 80.0 - 100.0 fL   MCH 31.1 26.0 - 34.0 pg   MCHC 33.0 30.0 - 36.0 g/dL   RDW 13.4 11.5 - 15.5 %   Platelets 244 150 - 400 K/uL   nRBC 0.0 0.0 - 0.2 %    Comment: Performed at [facility], [place] [address], [place], [place] [address]  Hepatitis panel, acute     Status: None   Collection Time: 04/03/19 12:25 PM  Result Value Ref Range   Hepatitis B Surface Ag NON REACTIVE NON REACTIVE   HCV Ab NON REACTIVE NON REACTIVE    Comment: (NOTE) Nonreactive HCV antibody screen is consistent with no HCV infections,  unless recent infection is suspected or other evidence exists to indicate HCV infection.    Hep A IgM NON REACTIVE NON REACTIVE   Hep B C IgM NON REACTIVE NON REACTIVE    Comment: Performed at [facility], [place] [address], [place], [place] [address]  Acetaminophen level     Status: Abnormal   Collection Time: 04/03/19 12:25 PM  Result Value Ref Range   Acetaminophen (Tylenol), Serum <10 (L) 10 - 30 ug/mL    Comment: (NOTE) Therapeutic concentrations vary significantly. A range of 10-30 ug/mL  may be an effective concentration for many patients. However, some  are best treated at concentrations outside of this range. Acetaminophen concentrations >150 ug/mL at 4 hours after ingestion  and >50 ug/mL at 12 hours after ingestion are often associated with  toxic reactions. Performed at [facility], [place] [address], [place],  [address]   Ethanol     Status: None   Collection  Time: 04/03/19 12:25 PM  Result Value Ref Range   Alcohol, Ethyl (B) <10 <10 mg/dL    Comment: (NOTE) Lowest detectable limit for serum alcohol is 10 mg/dL. For medical purposes only. Performed at [facility], [place] [address] [place], [place]  [address]   Gamma GT     Status: Abnormal   Collection Time: 04/03/19 12:25 PM  Result Value Ref Range   GGT 1,145 (H) 7 - 50 U/L    Comment: Performed at [facility], [place] [address] [place], [place] [address]  Lactate dehydrogenase     Status: Abnormal   Collection Time: 04/03/19 12:25 PM  Result Value Ref Range   LDH 748 (H) 98 - 192 U/L    Comment: Performed at [facility], [place] [address], [place], [place] [address]  Urinalysis, Routine w reflex microscopic     Status: Abnormal   Collection Time: 04/03/19  1:22 PM  Result Value Ref Range   Color, Urine AMBER (A) YELLOW    Comment: BIOCHEMICALS MAY BE AFFECTED BY COLOR   APPearance CLEAR CLEAR   Specific Gravity, Urine 1.010 1.005 - 1.030   pH 6.0 5.0 - 8.0   Glucose, UA NEGATIVE NEGATIVE mg/dL   Hgb urine dipstick NEGATIVE NEGATIVE   Bilirubin Urine NEGATIVE NEGATIVE   Ketones, ur NEGATIVE NEGATIVE mg/dL   Protein, ur 30 (A) NEGATIVE mg/dL   Nitrite NEGATIVE NEGATIVE   Leukocytes,Ua NEGATIVE NEGATIVE   RBC / HPF 0-5 0 - 5 RBC/hpf   WBC, UA 0-5 0 - 5 WBC/hpf   Bacteria, UA NONE SEEN NONE SEEN   Mucus PRESENT     Comment: Performed at [facility], [place] [address], [place], [place] [address]  SARS CORONAVIRUS 2 (TAT 6-24 HRS) Nasopharyngeal Nasopharyngeal Swab     Status: None   Collection Time: 04/03/19  4:29 PM   Specimen: Nasopharyngeal Swab  Result Value Ref Range   SARS Coronavirus 2 NEGATIVE NEGATIVE    Comment: (NOTE) SARS-CoV-2 target nucleic acids are NOT DETECTED. The SARS-CoV-2 RNA is generally detectable in upper and lower respiratory specimens during the acute phase of infection. Negative results do not preclude SARS-CoV-2 infection, do not  rule out co-infections with other pathogens, and should not be used as the sole basis for treatment or other patient management decisions. Negative results must be combined with clinical observations, patient history, and epidemiological information. The expected result is Negative. Fact Sheet for Patients: SugarRoll.be Fact Sheet for Healthcare Providers: https://www.woods-mathews.com/ This test is not yet approved or cleared by the [place] FDA and  has been authorized for detection and/or diagnosis of SARS-CoV-2 by FDA under an Emergency Use Authorization (EUA). This EUA will remain  in effect (meaning this test can be used) for the duration of the COVID-19 declaration under Section 56 4(b)(1) of the Act, 21 U.S.C. section 360bbb-3(b)(1), unless the authorization is terminated or revoked sooner. Performed at [facility], [place] [address], [place], [place] [address]   HIV Antibody (routine testing w rflx)     Status: None   Collection Time: 04/03/19  8:41 PM  Result Value Ref Range   HIV Screen 4th Generation wRfx NON REACTIVE NON REACTIVE    Comment: Performed at [facility], [place] [address], [place], [place] [address]  Hepatitis B surface antigen     Status: None   Collection Time: 04/03/19  8:41 PM  Result Value Ref Range   Hepatitis B Surface Ag NON REACTIVE NON REACTIVE    Comment: Performed at [facility] [address] [place], [place] [address]  Hepatitis B core antibody, IgM     Status: None   Collection Time: 04/03/19  8:41 PM  Result Value Ref Range  Hep B C IgM NON REACTIVE NON REACTIVE    Comment: Performed at [facility], [place] [address], [place], [place] [address]  Hepatitis A antibody, IgM     Status: None   Collection Time: 04/03/19  8:41 PM  Result Value Ref Range   Hep A IgM NON REACTIVE NON REACTIVE    Comment: Performed at [facility], [place] [address] [place], [place] [address]   Ferritin     Status: None   Collection Time: 04/03/19  8:41 PM  Result Value Ref Range   Ferritin 260 24 - 336 ng/mL    Comment: Performed at [facility] [address], [place], [place] [address]  Protime-INR     Status: None   Collection Time: 04/03/19  8:41 PM  Result Value Ref Range   Prothrombin Time 13.1 11.4 - 15.2 seconds   INR 1.0 0.8 - 1.2    Comment: (NOTE) INR goal varies based on device and disease states. Performed at [facility], [place] [address], [place], [place] [address]   Troponin I (High Sensitivity)     Status: None   Collection Time: 04/03/19  8:41 PM  Result Value Ref Range   Troponin I (High Sensitivity) 6 <18 ng/L    Comment: (NOTE) Elevated high sensitivity troponin I (hsTnI) values and significant  changes across serial measurements may suggest ACS but many other  chronic and acute conditions are known to elevate hsTnI results.  Refer to the "Links" section for chest pain algorithms and additional  guidance. Performed at [facility], [place] [address], [place], [place] [address]   Comprehensive metabolic panel     Status: Abnormal   Collection Time: 04/04/19  2:48 AM  Result Value Ref Range   Sodium 144 135 - 145 mmol/L   Potassium 4.3 3.5 - 5.1 mmol/L   Chloride 106 98 - 111 mmol/L   CO2 26 22 - 32 mmol/L   Glucose, Bld 106 (H) 70 - 99 mg/dL    Comment: Glucose reference range applies only to samples taken after fasting for at least 8 hours.   BUN 10 6 - 20 mg/dL   Creatinine, Ser 1.60 (H) 0.61 - 1.24 mg/dL   Calcium 8.9 8.9 - 10.3 mg/dL   Total Protein 7.1 6.5 - 8.1 g/dL   Albumin 3.4 (L) 3.5 - 5.0 g/dL   AST 512 (H) 15 - 41 U/L   ALT 754 (H) 0 - 44 U/L   Alkaline Phosphatase 344 (H) 38 - 126 U/L   Total Bilirubin 2.1 (H) 0.3 - 1.2 mg/dL   GFR calc non Af Amer 48 (L) >60 mL/min   GFR calc Af Amer 56 (L) >60 mL/min   Anion gap 12 5 - 15    Comment: Performed at [place] [address], [place]  [address]  CBC     Status: None   Collection Time: 04/04/19  2:48 AM  Result Value Ref Range   WBC 7.1 4.0 - 10.5 K/uL   RBC 5.10 4.22 - 5.81 MIL/uL   Hemoglobin 15.8 13.0 - 17.0 g/dL   HCT 48.3 39.0 - 52.0 %   MCV 94.7 80.0 - 100.0 fL   MCH 31.0 26.0 - 34.0 pg   MCHC 32.7 30.0 - 36.0 g/dL   RDW 13.8 11.5 - 15.5 %   Platelets 217 150 - 400 K/uL   nRBC 0.0 0.0 - 0.2 %  Comment: Performed at [facility], [place] [address] [place], [place] [address]  Protime-INR     Status: None   Collection Time: 04/04/19  2:48 AM  Result Value Ref Range   Prothrombin Time 13.7 11.4 - 15.2 seconds   INR 1.1 0.8 - 1.2    Comment: (NOTE) INR goal varies based on device and disease states. Performed at [facility], [place] [address] [place], [place] [address]   APTT     Status: None   Collection Time: 04/04/19  2:48 AM  Result Value Ref Range   aPTT 26 24 - 36 seconds    Comment: Performed at [place] [address], [place], [place] [address]    US Abdomen Complete  Result Date: 04/03/2019 CLINICAL DATA:  Elevated liver function tests, elevated lipase, abdominal pain. EXAM: ABDOMEN ULTRASOUND COMPLETE COMPARISON:  None. FINDINGS: Gallbladder: Decompressed. No gallstones or wall thickening visualized. No sonographic Murphy sign noted by sonographer. Common bile duct: Diameter: 7 mm, upper normal for age. Liver: No focal lesion identified. Coarse echotexture. Portal vein is patent on color Doppler imaging with normal direction of blood flow towards the liver. IVC: Not visualized. Pancreas: Not visualized due to overlying bowel gas. Spleen: Size and appearance within normal limits. Right Kidney: Length: 9.9 cm. Thin cortex. Echogenicity within normal limits. No mass or hydronephrosis visualized. Left Kidney: Length: 11.7 cm. Thin cortex. Echogenicity within normal limits. Mild hydronephrosis. No mass visualized. Abdominal aorta: Not visualized due to overlying bowel gas. Other findings:  None. Study limited by body habitus and a significant amount of bowel gas. IMPRESSION: 1. No gallstones identified.  No evidence of acute cholecystitis. 2. Liver with coarse echotexture raising the possibility of underlying cirrhosis. No focal liver lesion identified. 3. Common bile duct upper limits of normal in caliber at 7 mm diameter. No intrahepatic bile duct dilatation appreciated. 4. Mild LEFT renal hydronephrosis, of uncertain chronicity. 5. Both kidneys with thin cortices suggesting some degree of chronic medical renal disease and/or renal artery atherosclerosis. 6. Study limited by body habitus and a significant amount of bowel gas. Pancreas and abdominal aorta not visualized due to overlying bowel gas. Electronically Signed   By: Franki Cabot M.D.   On: 04/03/2019 15:58   MR ABDOMEN MRCP WO CONTRAST  Result Date: 04/03/2019 CLINICAL DATA:  Elevated lipase, possible pancreatitis EXAM: MRI ABDOMEN WITHOUT CONTRAST  (INCLUDING MRCP) TECHNIQUE: Multiplanar multisequence MR imaging of the abdomen was performed. Heavily T2-weighted images of the biliary and pancreatic ducts were obtained, and three-dimensional MRCP images were rendered by post processing. COMPARISON:  Ultrasound of the abdomen from earlier in the same day. FINDINGS: Lower chest: Within normal limits. Hepatobiliary: Liver demonstrates a cyst adjacent to the fundus of the gallbladder measuring 14 mm. No focal mass lesion is noted. No biliary ductal dilatation is seen. No definitive filling defects are noted within the common bile duct. The gallbladder is partially distended. No definitive gallstones are seen. Pancreas: Pancreas is well visualized without focal mass. Peripancreatic inflammatory changes and edema are noted which extend along the lateral aspect of Gerota's fascia on the left as well as surround the second portion of the duodenum extending inferiorly. Spleen:  Spleen is well visualized and within normal limits. Adrenals/Urinary  Tract: Adrenal glands are unremarkable. Right kidney shows no obstructive changes. Left kidney demonstrates a septated cyst in the lower pole as well as a parapelvic cyst. No obstructive changes are seen. The bladder is not visualized on  this exam. Stomach/Bowel: Visualized bowel structures show no acute abnormality. Vascular/Lymphatic: No aneurysmal dilatation is seen. No significant lymphadenopathy is noted. Other:  No free fluid is seen. Musculoskeletal: Bony structures appear within normal limits. IMPRESSION: No evidence of cholelithiasis or biliary ductal dilatation. Changes consistent with pancreatitis with surrounding edema as described. No pancreatic mass is seen. Hepatic cyst and renal cysts as described. Electronically Signed   By: Inez Catalina M.D.   On: 04/03/2019 21:13    ROS - all of the below systems have been reviewed with the patient and positives are indicated with bold text General: chills, fever or night sweats Eyes: blurry vision or double vision ENT: epistaxis or sore throat Allergy/Immunology: itchy/watery eyes or nasal congestion Hematologic/Lymphatic: bleeding problems, blood clots or swollen lymph nodes Endocrine: temperature intolerance or unexpected weight changes Breast: new or changing breast lumps or nipple discharge Resp: cough, shortness of breath, or wheezing CV: chest pain or dyspnea on exertion GI: as per HPI GU: dysuria, trouble voiding, or hematuria MSK: joint pain or joint stiffness Neuro: TIA or stroke symptoms Derm: pruritus and skin lesion changes Psych: anxiety and depression  PE Blood pressure (!) 160/106, pulse 100, temperature 98.2 F (36.8 C), temperature source Oral, resp. rate 18, height '5\' 10"'  (1.778 m), weight 101.2 kg, SpO2 94 %. Constitutional: NAD; conversant; no deformities Eyes: Moist conjunctiva; no lid lag; anicteric; PERRL Neck: Trachea midline; no thyromegaly Lungs: Normal respiratory effort; no tactile fremitus CV: RRR; no  palpable thrills; no pitting edema GI: Abd distended, soft, TTP in upper midline epigastric, lower abd; no palpable hepatosplenomegaly; no rebound MSK:  no clubbing/cyanosis Psychiatric: Appropriate affect; alert and oriented x3 Lymphatic: No palpable cervical or axillary lymphadenopathy Skin: no rash, induration, nodules; no bruise  Results for orders placed or performed during the hospital encounter of 04/03/19 (from the past 48 hour(s))  Lipase, blood     Status: Abnormal   Collection Time: 04/03/19 10:41 AM  Result Value Ref Range   Lipase 6,430 (H) 11 - 51 U/L    Comment: RESULTS CONFIRMED BY MANUAL DILUTION Performed at [facility], [address] [place], [place] [address]   Comprehensive metabolic panel     Status: Abnormal   Collection Time: 04/03/19 10:41 AM  Result Value Ref Range   Sodium 140 135 - 145 mmol/L   Potassium 4.0 3.5 - 5.1 mmol/L   Chloride 103 98 - 111 mmol/L   CO2 26 22 - 32 mmol/L   Glucose, Bld 174 (H) 70 - 99 mg/dL    Comment: Glucose reference range applies only to samples taken after fasting for at least 8 hours.   BUN 13 6 - 20 mg/dL   Creatinine, Ser 1.81 (H) 0.61 - 1.24 mg/dL   Calcium 10.1 8.9 - 10.3 mg/dL   Total Protein 8.3 (H) 6.5 - 8.1 g/dL   Albumin 4.1 3.5 - 5.0 g/dL   AST 1,168 (H) 15 - 41 U/L   ALT 920 (H) 0 - 44 U/L   Alkaline Phosphatase 384 (H) 38 - 126 U/L   Total Bilirubin 3.8 (H) 0.3 - 1.2 mg/dL   GFR calc non Af Amer 42 (L) >60 mL/min   GFR calc Af Amer 48 (L) >60 mL/min   Anion gap 11 5 - 15    Comment: Performed at [facility] [address] [place], [place] [address]  CBC     Status: Abnormal   Collection Time: 04/03/19 10:41 AM  Result Value  Ref Range   WBC 9.7 4.0 - 10.5 K/uL   RBC 5.60 4.22 - 5.81 MIL/uL   Hemoglobin 17.4 (H) 13.0 - 17.0 g/dL   HCT 52.8 (H) 39.0 - 52.0 %   MCV 94.3 80.0 - 100.0 fL   MCH 31.1 26.0 - 34.0 pg   MCHC 33.0 30.0 - 36.0 g/dL   RDW 13.4 11.5 - 15.5 %   Platelets 244 150 -  400 K/uL   nRBC 0.0 0.0 - 0.2 %    Comment: Performed at [place] [facility] [address], [place], [place] [address]  Hepatitis panel, acute     Status: None   Collection Time: 04/03/19 12:25 PM  Result Value Ref Range   Hepatitis B Surface Ag NON REACTIVE NON REACTIVE   HCV Ab NON REACTIVE NON REACTIVE    Comment: (NOTE) Nonreactive HCV antibody screen is consistent with no HCV infections,  unless recent infection is suspected or other evidence exists to indicate HCV infection.    Hep A IgM NON REACTIVE NON REACTIVE   Hep B C IgM NON REACTIVE NON REACTIVE    Comment: Performed at [facility], [place] [address], [place], [place] [address]  Acetaminophen level     Status: Abnormal   Collection Time: 04/03/19 12:25 PM  Result Value Ref Range   Acetaminophen (Tylenol), Serum <10 (L) 10 - 30 ug/mL    Comment: (NOTE) Therapeutic concentrations vary significantly. A range of 10-30 ug/mL  may be an effective concentration for many patients. However, some  are best treated at concentrations outside of this range. Acetaminophen concentrations >150 ug/mL at 4 hours after ingestion  and >50 ug/mL at 12 hours after ingestion are often associated with  toxic reactions. Performed at [facility], [place] [address], [place], [place] [address]   Ethanol     Status: None   Collection Time: 04/03/19 12:25 PM  Result Value Ref Range   Alcohol, Ethyl (B) <10 <10 mg/dL    Comment: (NOTE) Lowest detectable limit for serum alcohol is 10 mg/dL. For medical purposes only. Performed at [facility], [place] [address] [place], [place] [address]   Gamma GT     Status: Abnormal   Collection Time: 04/03/19 12:25 PM  Result Value Ref Range   GGT 1,145 (H) 7 - 50 U/L    Comment: Performed at [facility], [place] [address], [place], [place] [address]  Lactate dehydrogenase     Status: Abnormal   Collection Time: 04/03/19 12:25 PM  Result Value Ref Range   LDH 748 (H) 98 - 192 U/L     Comment: Performed at [facility], [place] [address] [place], [place] [address]  Urinalysis, Routine w reflex microscopic     Status: Abnormal   Collection Time: 04/03/19  1:22 PM  Result Value Ref Range   Color, Urine AMBER (A) YELLOW    Comment: BIOCHEMICALS MAY BE AFFECTED BY COLOR   APPearance CLEAR CLEAR   Specific Gravity, Urine 1.010 1.005 - 1.030   pH 6.0 5.0 - 8.0   Glucose, UA NEGATIVE NEGATIVE mg/dL   Hgb urine dipstick NEGATIVE NEGATIVE   Bilirubin Urine NEGATIVE NEGATIVE   Ketones, ur NEGATIVE NEGATIVE mg/dL   Protein, ur 30 (A) NEGATIVE mg/dL   Nitrite NEGATIVE NEGATIVE   Leukocytes,Ua NEGATIVE NEGATIVE   RBC / HPF 0-5 0 - 5 RBC/hpf   WBC, UA 0-5 0 - 5 WBC/hpf   Bacteria, UA  NONE SEEN NONE SEEN   Mucus PRESENT     Comment: Performed at [facility], [place] [address] [place], [place] [address]  SARS CORONAVIRUS 2 (TAT 6-24 HRS) Nasopharyngeal Nasopharyngeal Swab     Status: None   Collection Time: 04/03/19  4:29 PM   Specimen: Nasopharyngeal Swab  Result Value Ref Range   SARS Coronavirus 2 NEGATIVE NEGATIVE    Comment: (NOTE) SARS-CoV-2 target nucleic acids are NOT DETECTED. The SARS-CoV-2 RNA is generally detectable in upper and lower respiratory specimens during the acute phase of infection. Negative results do not preclude SARS-CoV-2 infection, do not rule out co-infections with other pathogens, and should not be used as the sole basis for treatment or other patient management decisions. Negative results must be combined with clinical observations, patient history, and epidemiological information. The expected result is Negative. Fact Sheet for Patients: SugarRoll.be Fact Sheet for Healthcare Providers: https://www.woods-mathews.com/ This test is not yet approved or cleared by the [place] FDA and  has been authorized for detection and/or diagnosis of SARS-CoV-2 by FDA under an Emergency Use  Authorization (EUA). This EUA will remain  in effect (meaning this test can be used) for the duration of the COVID-19 declaration under Section 56 4(b)(1) of the Act, 21 U.S.C. section 360bbb-3(b)(1), unless the authorization is terminated or revoked sooner. Performed at [facility], [place] [address], [place], [place] [address]   HIV Antibody (routine testing w rflx)     Status: None   Collection Time: 04/03/19  8:41 PM  Result Value Ref Range   HIV Screen 4th Generation wRfx NON REACTIVE NON REACTIVE    Comment: Performed at [facility], [place] [address] [place], [place] [address]  Hepatitis B surface antigen     Status: None   Collection Time: 04/03/19  8:41 PM  Result Value Ref Range   Hepatitis B Surface Ag NON REACTIVE NON REACTIVE    Comment: Performed at [facility] [address] [place], [place] [address]  Hepatitis B core antibody, IgM     Status: None   Collection Time: 04/03/19  8:41 PM  Result Value Ref Range   Hep B C IgM NON REACTIVE NON REACTIVE    Comment: Performed at [facility] [address], [place], [place] [address]  Hepatitis A antibody, IgM     Status: None   Collection Time: 04/03/19  8:41 PM  Result Value Ref Range   Hep A IgM NON REACTIVE NON REACTIVE    Comment: Performed at [facility], [place] [address] [place], [place] [address]  Ferritin     Status: None   Collection Time: 04/03/19  8:41 PM  Result Value Ref Range   Ferritin 260 24 - 336 ng/mL    Comment: Performed at [facility] [address], [place],  [address]  Protime-INR     Status: None   Collection Time: 04/03/19  8:41 PM  Result Value Ref Range   Prothrombin Time 13.1 11.4 - 15.2 seconds   INR 1.0 0.8 - 1.2    Comment: (NOTE) INR goal varies based on device and disease states. Performed at [facility], [place] [address], [place], [place] [address]   Troponin I (High Sensitivity)     Status: None   Collection Time: 04/03/19  8:41  PM  Result Value Ref Range   Troponin I (High Sensitivity) 6 <18 ng/L    Comment: (NOTE)  Elevated high sensitivity troponin I (hsTnI) values and significant  changes across serial measurements may suggest ACS but many other  chronic and acute conditions are known to elevate hsTnI results.  Refer to the "Links" section for chest pain algorithms and additional  guidance. Performed at [facility], [place] [address] [place], [place] [address]   Comprehensive metabolic panel     Status: Abnormal   Collection Time: 04/04/19  2:48 AM  Result Value Ref Range   Sodium 144 135 - 145 mmol/L   Potassium 4.3 3.5 - 5.1 mmol/L   Chloride 106 98 - 111 mmol/L   CO2 26 22 - 32 mmol/L   Glucose, Bld 106 (H) 70 - 99 mg/dL    Comment: Glucose reference range applies only to samples taken after fasting for at least 8 hours.   BUN 10 6 - 20 mg/dL   Creatinine, Ser 1.60 (H) 0.61 - 1.24 mg/dL   Calcium 8.9 8.9 - 10.3 mg/dL   Total Protein 7.1 6.5 - 8.1 g/dL   Albumin 3.4 (L) 3.5 - 5.0 g/dL   AST 512 (H) 15 - 41 U/L   ALT 754 (H) 0 - 44 U/L   Alkaline Phosphatase 344 (H) 38 - 126 U/L   Total Bilirubin 2.1 (H) 0.3 - 1.2 mg/dL   GFR calc non Af Amer 48 (L) >60 mL/min   GFR calc Af Amer 56 (L) >60 mL/min   Anion gap 12 5 - 15    Comment: Performed at [facility] [address], [place] [address]  CBC     Status: None   Collection Time: 04/04/19  2:48 AM  Result Value Ref Range   WBC 7.1 4.0 - 10.5 K/uL   RBC 5.10 4.22 - 5.81 MIL/uL   Hemoglobin 15.8 13.0 - 17.0 g/dL   HCT 48.3 39.0 - 52.0 %   MCV 94.7 80.0 - 100.0 fL   MCH 31.0 26.0 - 34.0 pg   MCHC 32.7 30.0 - 36.0 g/dL   RDW 13.8 11.5 - 15.5 %   Platelets 217 150 - 400 K/uL   nRBC 0.0 0.0 - 0.2 %    Comment: Performed at [facility], [place] [address], [place], [place] [address]  Protime-INR     Status: None   Collection Time: 04/04/19  2:48 AM  Result Value Ref Range   Prothrombin Time 13.7 11.4 - 15.2 seconds    INR 1.1 0.8 - 1.2    Comment: (NOTE) INR goal varies based on device and disease states. Performed at [facility], [place] [address] [place], [place] [address]   APTT     Status: None   Collection Time: 04/04/19  2:48 AM  Result Value Ref Range   aPTT 26 24 - 36 seconds    Comment: Performed at [facility] [address] [place],  [address]    US Abdomen Complete  Result Date: 04/03/2019 CLINICAL DATA:  Elevated liver function tests, elevated lipase, abdominal pain. EXAM: ABDOMEN ULTRASOUND COMPLETE COMPARISON:  None. FINDINGS: Gallbladder: Decompressed. No gallstones or wall thickening visualized. No sonographic Murphy sign noted by sonographer. Common bile duct: Diameter: 7 mm, upper normal for age. Liver: No focal lesion identified. Coarse echotexture. Portal vein is patent on color Doppler imaging with normal direction of blood flow towards the liver. IVC: Not visualized. Pancreas: Not visualized due to overlying bowel gas. Spleen: Size and appearance within normal limits. Right Kidney: Length:  9.9 cm. Thin cortex. Echogenicity within normal limits. No mass or hydronephrosis visualized. Left Kidney: Length: 11.7 cm. Thin cortex. Echogenicity within normal limits. Mild hydronephrosis. No mass visualized. Abdominal aorta: Not visualized due to overlying bowel gas. Other findings: None. Study limited by body habitus and a significant amount of bowel gas. IMPRESSION: 1. No gallstones identified.  No evidence of acute cholecystitis. 2. Liver with coarse echotexture raising the possibility of underlying cirrhosis. No focal liver lesion identified. 3. Common bile duct upper limits of normal in caliber at 7 mm diameter. No intrahepatic bile duct dilatation appreciated. 4. Mild LEFT renal hydronephrosis, of uncertain chronicity. 5. Both kidneys with thin cortices suggesting some degree of chronic medical renal disease and/or renal artery atherosclerosis. 6. Study limited by body  habitus and a significant amount of bowel gas. Pancreas and abdominal aorta not visualized due to overlying bowel gas. Electronically Signed   By: Franki Cabot M.D.   On: 04/03/2019 15:58   MR ABDOMEN MRCP WO CONTRAST  Result Date: 04/03/2019 CLINICAL DATA:  Elevated lipase, possible pancreatitis EXAM: MRI ABDOMEN WITHOUT CONTRAST  (INCLUDING MRCP) TECHNIQUE: Multiplanar multisequence MR imaging of the abdomen was performed. Heavily T2-weighted images of the biliary and pancreatic ducts were obtained, and three-dimensional MRCP images were rendered by post processing. COMPARISON:  Ultrasound of the abdomen from earlier in the same day. FINDINGS: Lower chest: Within normal limits. Hepatobiliary: Liver demonstrates a cyst adjacent to the fundus of the gallbladder measuring 14 mm. No focal mass lesion is noted. No biliary ductal dilatation is seen. No definitive filling defects are noted within the common bile duct. The gallbladder is partially distended. No definitive gallstones are seen. Pancreas: Pancreas is well visualized without focal mass. Peripancreatic inflammatory changes and edema are noted which extend along the lateral aspect of Gerota's fascia on the left as well as surround the second portion of the duodenum extending inferiorly. Spleen:  Spleen is well visualized and within normal limits. Adrenals/Urinary Tract: Adrenal glands are unremarkable. Right kidney shows no obstructive changes. Left kidney demonstrates a septated cyst in the lower pole as well as a parapelvic cyst. No obstructive changes are seen. The bladder is not visualized on this exam. Stomach/Bowel: Visualized bowel structures show no acute abnormality. Vascular/Lymphatic: No aneurysmal dilatation is seen. No significant lymphadenopathy is noted. Other:  No free fluid is seen. Musculoskeletal: Bony structures appear within normal limits. IMPRESSION: No evidence of cholelithiasis or biliary ductal dilatation. Changes consistent with  pancreatitis with surrounding edema as described. No pancreatic mass is seen. Hepatic cyst and renal cysts as described. Electronically Signed   By: Inez Catalina M.D.   On: 04/03/2019 21:13    Imaging: reviewed  A/P: Keiland Pickering is an 54 y.o. male with  Pancreatitis Transaminitis Elevated blood pressure Abdominal pain Ultrasound suggesting nodular liver-cirrhosis obesity  His LFTs are trending down.  There is no radiological evidence of cholelithiasis.  I am not sure if his pancreatitis is due to gallstones.  His prior episodes could have been prior pancreatitis episodes as well.  He does not need antibiotics for the gallbladder or for the pancreas-recommend stopping antibiotic Trend labs Supportive care for pancreatitis   Leighton Ruff. Redmond Pulling, MD, FACS General, Bariatric, & Minimally Invasive Surgery [facility], [place]

## 2019-04-04 NOTE — Plan of Care (Signed)
  Problem: Elimination: Goal: Will not experience complications related to bowel motility Outcome: Progressing Goal: Will not experience complications related to urinary retention Outcome: Progressing  Adequate UOP. Uses urinal  Problem: Pain Managment: Goal: General experience of comfort will improve Outcome: Progressing  PRN dilaudid given as appropriate throughout shift. Problem: Safety: Goal: Ability to remain free from injury will improve Outcome: Progressing   Problem: Skin Integrity: Goal: Risk for impaired skin integrity will decrease Outcome: Progressing

## 2019-04-04 NOTE — Progress Notes (Signed)
Subjective: Patient states that he had some fruits today morning and immediately had worsening abdominal pain.  He does not want his diet to be advanced and wants to stay on clear liquid.  He has not had a bowel movement but has been passing gas.  He denies nausea or vomiting.  Objective: Vital signs in last 24 hours: Temp:  [98.2 F (36.8 C)-100 F (37.8 C)] 100 F (37.8 C) (03/12 1422) Pulse Rate:  [100-109] 109 (03/12 1422) Resp:  [16-18] 16 (03/12 0938) BP: (150-161)/(92-121) 153/102 (03/12 1532) SpO2:  [94 %-100 %] 94 % (03/12 1422) Weight change:  Last BM Date: 04/03/19  PE: Lying comfortably on bed GENERAL: No pallor, no icterus ABDOMEN: Distended, mild generalized tenderness, normal active bowel sounds EXTREMITIES: No deformity  Lab Results: Results for orders placed or performed during the hospital encounter of 04/03/19 (from the past 48 hour(s))  Lipase, blood     Status: Abnormal   Collection Time: 04/03/19 10:41 AM  Result Value Ref Range   Lipase 6,430 (H) 11 - 51 U/L    Comment: RESULTS CONFIRMED BY MANUAL DILUTION Performed at [facility], [address], [place], [place] [address]   Comprehensive metabolic panel     Status: Abnormal   Collection Time: 04/03/19 10:41 AM  Result Value Ref Range   Sodium 140 135 - 145 mmol/L   Potassium 4.0 3.5 - 5.1 mmol/L   Chloride 103 98 - 111 mmol/L   CO2 26 22 - 32 mmol/L   Glucose, Bld 174 (H) 70 - 99 mg/dL    Comment: Glucose reference range applies only to samples taken after fasting for at least 8 hours.   BUN 13 6 - 20 mg/dL   Creatinine, Ser 6.04 (H) 0.61 - 1.24 mg/dL   Calcium 54.0 8.9 - 98.1 mg/dL   Total Protein 8.3 (H) 6.5 - 8.1 g/dL   Albumin 4.1 3.5 - 5.0 g/dL   AST 1,914 (H) 15 - 41 U/L   ALT 920 (H) 0 - 44 U/L   Alkaline Phosphatase 384 (H) 38 - 126 U/L   Total Bilirubin 3.8 (H) 0.3 - 1.2 mg/dL   GFR calc non Af Amer 42 (L) >60 mL/min   GFR calc Af Amer 48 (L) >60 mL/min   Anion gap 11 5 - 15     Comment: Performed at [facility], [address], [place], [place] [address]  CBC     Status: Abnormal   Collection Time: 04/03/19 10:41 AM  Result Value Ref Range   WBC 9.7 4.0 - 10.5 K/uL   RBC 5.60 4.22 - 5.81 MIL/uL   Hemoglobin 17.4 (H) 13.0 - 17.0 g/dL   HCT 62.1 (H) 30.8 - 65.7 %   MCV 94.3 80.0 - 100.0 fL   MCH 31.1 26.0 - 34.0 pg   MCHC 33.0 30.0 - 36.0 g/dL   RDW 84.6 96.2 - 95.2 %   Platelets 244 150 - 400 K/uL   nRBC 0.0 0.0 - 0.2 %    Comment: Performed at [facility], [address], [place], [place] [address]  Hepatitis panel, acute     Status: None   Collection Time: 04/03/19 12:25 PM  Result Value Ref Range   Hepatitis B Surface Ag NON REACTIVE NON REACTIVE   HCV Ab NON REACTIVE NON REACTIVE    Comment: (NOTE) Nonreactive HCV antibody screen is consistent with no HCV infections,  unless recent infection is suspected or other evidence  exists to indicate HCV infection.    Hep A IgM NON REACTIVE NON REACTIVE   Hep B C IgM NON REACTIVE NON REACTIVE    Comment: Performed at [facility], [place] [address] [place], [place] [address]  Acetaminophen level     Status: Abnormal   Collection Time: 04/03/19 12:25 PM  Result Value Ref Range   Acetaminophen (Tylenol), Serum <10 (L) 10 - 30 ug/mL    Comment: (NOTE) Therapeutic concentrations vary significantly. A range of 10-30 ug/mL  may be an effective concentration for many patients. However, some  are best treated at concentrations outside of this range. Acetaminophen concentrations >150 ug/mL at 4 hours after ingestion  and >50 ug/mL at 12 hours after ingestion are often associated with  toxic reactions. Performed at [facility], [place] [address], [place], [place] [address]   Ethanol     Status: None   Collection Time: 04/03/19 12:25 PM  Result Value Ref Range   Alcohol, Ethyl (B) <10 <10 mg/dL    Comment: (NOTE) Lowest detectable limit for serum alcohol is 10 mg/dL. For medical  purposes only. Performed at [facility], [place] [address] [place], [place] [address]   Gamma GT     Status: Abnormal   Collection Time: 04/03/19 12:25 PM  Result Value Ref Range   GGT 1,145 (H) 7 - 50 U/L    Comment: Performed at [facility], [place] [address] [place], [place] [address]  Lactate dehydrogenase     Status: Abnormal   Collection Time: 04/03/19 12:25 PM  Result Value Ref Range   LDH 748 (H) 98 - 192 U/L    Comment: Performed at [facility], [place] [address] [place], [place] [address]  Urinalysis, Routine w reflex microscopic     Status: Abnormal   Collection Time: 04/03/19  1:22 PM  Result Value Ref Range   Color, Urine AMBER (A) YELLOW    Comment: BIOCHEMICALS MAY BE AFFECTED BY COLOR   APPearance CLEAR CLEAR   Specific Gravity, Urine 1.010 1.005 - 1.030   pH 6.0 5.0 - 8.0   Glucose, UA NEGATIVE NEGATIVE mg/dL   Hgb urine dipstick NEGATIVE NEGATIVE   Bilirubin Urine NEGATIVE NEGATIVE   Ketones, ur NEGATIVE NEGATIVE mg/dL   Protein, ur 30 (A) NEGATIVE mg/dL   Nitrite NEGATIVE NEGATIVE   Leukocytes,Ua NEGATIVE NEGATIVE   RBC / HPF 0-5 0 - 5 RBC/hpf   WBC, UA 0-5 0 - 5 WBC/hpf   Bacteria, UA NONE SEEN NONE SEEN   Mucus PRESENT     Comment: Performed at [facility], [place] [address], [place], [place] [address]  SARS CORONAVIRUS 2 (TAT 6-24 HRS) Nasopharyngeal Nasopharyngeal Swab     Status: None   Collection Time: 04/03/19  4:29 PM   Specimen: Nasopharyngeal Swab  Result Value Ref Range   SARS Coronavirus 2 NEGATIVE NEGATIVE    Comment: (NOTE) SARS-CoV-2 target nucleic acids are NOT DETECTED. The SARS-CoV-2 RNA is generally detectable in upper and lower respiratory specimens during the acute phase of infection. Negative results do not preclude SARS-CoV-2 infection, do not rule out co-infections with other pathogens, and should not be used as the sole basis for treatment or other patient management decisions. Negative results must be  combined with clinical observations, patient history, and epidemiological information. The expected result is Negative. Fact Sheet for Patients: SugarRoll.be Fact Sheet for Healthcare Providers: https://www.woods-mathews.com/ This test is not yet approved or cleared  by the [place] and  has been authorized for detection and/or diagnosis of SARS-CoV-2 by FDA under an Emergency Use Authorization (EUA). This EUA will remain  in effect (meaning this test can be used) for the duration of the COVID-19 declaration under Section 56 4(b)(1) of the Act, 21 U.S.C. section 360bbb-3(b)(1), unless the authorization is terminated or revoked sooner. Performed at [facility], [address], [place], [place] [address]   HIV Antibody (routine testing w rflx)     Status: None   Collection Time: 04/03/19  8:41 PM  Result Value Ref Range   HIV Screen 4th Generation wRfx NON REACTIVE NON REACTIVE    Comment: Performed at [facility], [address], [place], [place] [address]  Hepatitis B surface antigen     Status: None   Collection Time: 04/03/19  8:41 PM  Result Value Ref Range   Hepatitis B Surface Ag NON REACTIVE NON REACTIVE    Comment: Performed at [facility], [address] [place], [place] [address]  Hepatitis B core antibody, IgM     Status: None   Collection Time: 04/03/19  8:41 PM  Result Value Ref Range   Hep B C IgM NON REACTIVE NON REACTIVE    Comment: Performed at [facility], [address], [place], [place] [address]  Hepatitis A antibody, IgM     Status: None   Collection Time: 04/03/19  8:41 PM  Result Value Ref Range   Hep A IgM NON REACTIVE NON REACTIVE    Comment: Performed at [facility], [address] [place], [place] [address]  Ferritin     Status: None   Collection Time: 04/03/19  8:41 PM  Result Value Ref Range   Ferritin 260 24 - 336 ng/mL    Comment: Performed at [facility], [address] [place], [place] [address]  Protime-INR     Status: None   Collection Time: 04/03/19  8:41 PM  Result Value Ref Range   Prothrombin Time 13.1 11.4 - 15.2 seconds   INR 1.0 0.8 - 1.2    Comment: (NOTE) INR goal varies based on device and disease states. Performed at [facility], [address], [place], [place] [address]   Troponin I (High Sensitivity)     Status: None   Collection Time: 04/03/19  8:41 PM  Result Value Ref Range   Troponin I (High Sensitivity) 6 <18 ng/L    Comment: (NOTE) Elevated high sensitivity troponin I (hsTnI) values and significant  changes across serial measurements may suggest ACS but many other  chronic and acute conditions are known to elevate hsTnI results.  Refer to the "Links" section for chest pain algorithms and additional  guidance. Performed at [facility], [address], [place], [place] [address]   Comprehensive metabolic panel     Status: Abnormal   Collection Time: 04/04/19  2:48 AM  Result Value Ref Range   Sodium 144 135 - 145 mmol/L   Potassium 4.3 3.5 - 5.1 mmol/L   Chloride 106 98 - 111 mmol/L   CO2 26 22 - 32 mmol/L   Glucose, Bld 106 (H) 70 - 99 mg/dL    Comment: Glucose reference range applies only to samples taken after fasting for at least 8 hours.   BUN 10 6 - 20 mg/dL   Creatinine, Ser 1.02 (H) 0.61 - 1.24 mg/dL   Calcium 8.9 8.9 -  10.3 mg/dL   Total Protein 7.1 6.5 - 8.1 g/dL   Albumin 3.4 (L) 3.5 - 5.0 g/dL   AST 161512 (H) 15 - 41 U/L   ALT 754 (H) 0 - 44 U/L   Alkaline Phosphatase 344 (H) 38 - 126 U/L   Total Bilirubin 2.1 (H) 0.3 - 1.2 mg/dL   GFR calc non Af Amer 48 (L) >60 mL/min   GFR calc Af Amer 56 (L) >60 mL/min   Anion gap 12 5 - 15    Comment: Performed at [facility], [address] [place], [place] [address]  CBC     Status: None   Collection Time: 04/04/19  2:48 AM  Result Value Ref Range   WBC 7.1 4.0 - 10.5 K/uL   RBC 5.10 4.22 - 5.81 MIL/uL   Hemoglobin  15.8 13.0 - 17.0 g/dL   HCT 54.048.3 98.139.0 - 19.152.0 %   MCV 94.7 80.0 - 100.0 fL   MCH 31.0 26.0 - 34.0 pg   MCHC 32.7 30.0 - 36.0 g/dL   RDW 47.813.8 29.511.5 - 62.115.5 %   Platelets 217 150 - 400 K/uL   nRBC 0.0 0.0 - 0.2 %    Comment: Performed at [facility], [address] [place], [place] [address]  Protime-INR     Status: None   Collection Time: 04/04/19  2:48 AM  Result Value Ref Range   Prothrombin Time 13.7 11.4 - 15.2 seconds   INR 1.1 0.8 - 1.2    Comment: (NOTE) INR goal varies based on device and disease states. Performed at [facility], [address] [place], [place] [address]   APTT     Status: None   Collection Time: 04/04/19  2:48 AM  Result Value Ref Range   aPTT 26 24 - 36 seconds    Comment: Performed at [facility], [address] [place], [place] [address]  Lipid panel     Status: Abnormal   Collection Time: 04/04/19 11:00 AM  Result Value Ref Range   Cholesterol 246 (H) 0 - 200 mg/dL   Triglycerides 73 <841<150 mg/dL   HDL 35 (L) >32>40 mg/dL   Total CHOL/HDL Ratio 7.0 RATIO   VLDL 15 0 - 40 mg/dL   LDL Cholesterol 440196 (H) 0 - 99 mg/dL    Comment:        Total Cholesterol/HDL:CHD Risk Coronary Heart Disease Risk Table                     Men   Women  1/2 Average Risk   3.4   3.3  Average Risk       5.0   4.4  2 X Average Risk   9.6   7.1  3 X Average Risk  23.4   11.0        Use the calculated Patient Ratio above and the CHD Risk Table to determine the patient's CHD Risk.        ATP III CLASSIFICATION (LDL):  <100     mg/dL   Optimal  102-725100-129  mg/dL   Near or Above                    Optimal  130-159  mg/dL   Borderline  366-440160-189  mg/dL   High  >347>190     mg/dL   Very High Performed at [facility], [address] [place], [place]  [address]     Studies/Results: US Abdomen Complete  Result Date: 04/03/2019 CLINICAL DATA:  Elevated liver function tests, elevated lipase, abdominal pain. EXAM: ABDOMEN ULTRASOUND COMPLETE COMPARISON:   None. FINDINGS: Gallbladder: Decompressed. No gallstones or wall thickening visualized. No sonographic Murphy sign noted by sonographer. Common bile duct: Diameter: 7 mm, upper normal for age. Liver: No focal lesion identified. Coarse echotexture. Portal vein is patent on color Doppler imaging with normal direction of blood flow towards the liver. IVC: Not visualized. Pancreas: Not visualized due to overlying bowel gas. Spleen: Size and appearance within normal limits. Right Kidney: Length: 9.9 cm. Thin cortex. Echogenicity within normal limits. No mass or hydronephrosis visualized. Left Kidney: Length: 11.7 cm. Thin cortex. Echogenicity within normal limits. Mild hydronephrosis. No mass visualized. Abdominal aorta: Not visualized due to overlying bowel gas. Other findings: None. Study limited by body habitus and a significant amount of bowel gas. IMPRESSION: 1. No gallstones identified.  No evidence of acute cholecystitis. 2. Liver with coarse echotexture raising the possibility of underlying cirrhosis. No focal liver lesion identified. 3. Common bile duct upper limits of normal in caliber at 7 mm diameter. No intrahepatic bile duct dilatation appreciated. 4. Mild LEFT renal hydronephrosis, of uncertain chronicity. 5. Both kidneys with thin cortices suggesting some degree of chronic medical renal disease and/or renal artery atherosclerosis. 6. Study limited by body habitus and a significant amount of bowel gas. Pancreas and abdominal aorta not visualized due to overlying bowel gas. Electronically Signed   By: Bary Richard M.D.   On: 04/03/2019 15:58   MR ABDOMEN MRCP WO CONTRAST  Result Date: 04/03/2019 CLINICAL DATA:  Elevated lipase, possible pancreatitis EXAM: MRI ABDOMEN WITHOUT CONTRAST  (INCLUDING MRCP) TECHNIQUE: Multiplanar multisequence MR imaging of the abdomen was performed. Heavily T2-weighted images of the biliary and pancreatic ducts were obtained, and three-dimensional MRCP images were rendered  by post processing. COMPARISON:  Ultrasound of the abdomen from earlier in the same day. FINDINGS: Lower chest: Within normal limits. Hepatobiliary: Liver demonstrates a cyst adjacent to the fundus of the gallbladder measuring 14 mm. No focal mass lesion is noted. No biliary ductal dilatation is seen. No definitive filling defects are noted within the common bile duct. The gallbladder is partially distended. No definitive gallstones are seen. Pancreas: Pancreas is well visualized without focal mass. Peripancreatic inflammatory changes and edema are noted which extend along the lateral aspect of Gerota's fascia on the left as well as surround the second portion of the duodenum extending inferiorly. Spleen:  Spleen is well visualized and within normal limits. Adrenals/Urinary Tract: Adrenal glands are unremarkable. Right kidney shows no obstructive changes. Left kidney demonstrates a septated cyst in the lower pole as well as a parapelvic cyst. No obstructive changes are seen. The bladder is not visualized on this exam. Stomach/Bowel: Visualized bowel structures show no acute abnormality. Vascular/Lymphatic: No aneurysmal dilatation is seen. No significant lymphadenopathy is noted. Other:  No free fluid is seen. Musculoskeletal: Bony structures appear within normal limits. IMPRESSION: No evidence of cholelithiasis or biliary ductal dilatation. Changes consistent with pancreatitis with surrounding edema as described. No pancreatic mass is seen. Hepatic cyst and renal cysts as described. Electronically Signed   By: Alcide Clever M.D.   On: 04/03/2019 21:13    Medications: I have reviewed the patient's current medications.  Assessment: Pancreatitis-abdominal pain, elevated lipase, peripancreatic inflammatory changes and edema noted on MRI. Unclear etiology-no evidence of cholelithiasis, biliary dilatation or choledocholithiasis on MRI, however LFTs are abnormal, normal triglycerides Trending  down, T bili 2.1, AST  512, ALT 754, ALP 344  No leukocytosis Hemoglobin trending, BUN 10, creatinine 1.6 and GFR 56, improving renal function  Work-up for elevated liver enzymes so far unremarkable-normal ferritin, normal alpha-1 antitrypsin, negative serology for hepatitis A, B and C. Pending labs for ANA, ASMA, AMA and ceruloplasmin.  Ultrasound shows coarse echotexture of liver concerning for underlying cirrhosis MRI showed a 14 mm cyst adjacent to fundus of gallbladder with no focal mass   Plan: Change diet to clear liquid, patient not able to tolerate solids as it caused worsening abdominal pain. Continue normal saline at 150 mL/h for now. On Dilaudid 0.5 mg every 4 hours for pain. Antibiotics discontinued after surgical evaluation as imaging not compatible with cholecystitis.  Kerin Salen, MD 04/04/2019, 3:37 PM

## 2019-04-04 NOTE — Progress Notes (Signed)
Hospitalist progress note   Patient from home, Patient going presumably home, Dispo inpatient at this time pending work-up of further medical issues  Todd Buchanan [id number] DOB: [birth year] DOA: 04/03/2019  PCP: Patient, No Pcp Per   Narrative:  50 BM known history of reflux without other medical issues probable HTN?  CHF started having nausea vomiting early January-went to see [facility] with 10/10 abdominal pain cramping nonbloody vomiting Also weight loss and intermittent heartburn taking a lot of Pepcid Came to emergency room ultrasound did not show gallstone cholecystitis-CBD 7 Found to have pancreatitis lipase 6430, AST/ALT 1168/920, bilirubin 3.8, GGT 1145 MRCP was negative patient given bolus IV fluids 175 cc an hour and because of elevated GGT autoimmune work-up pursued  Data Reviewed:  Hepatitis ASMA, mitochondrial antibody, cellular plasma and ANA alpha-1 antitrypsin, alpha-fetoprotein is pending. BUN/creatinine 13/1.8-->10/1.6 Alk phos 384-->344 AST 1168-->512, ALT 920-->754 White count 7.1 hemoglobin 15.8   Assessment & Plan:  Cryptogenic pancreatitis For completion sake obtain lipid panel although seems like this may be more autoimmune Continue saline 150 cc/h Given MRCP performed is negative for stone or ductal dilatation will allow a soft diet and defer to GI further plans It appears he was seen by an urgent care 03/11/2019 with similar episode For pain can continue Dilaudid 0.5 every 4 as needed but avoid Tylenol-containing products I have discontinued Zosyn Uncontrolled likely white coat HTN component Continue labetalol 10 every 4 as needed systolic >935-TSV use Ativan if anxious 0.5 orally sparingly Heart failure History of heart failure diagnosed 4-5 years ago in [place] when he was living there-lost 40 pounds and is not on medication currently May need to reinitiate at least blood pressure control if continues to have the same Outpatient  echo in the remote future Reported reflux Continue Protonix 40 IV twice daily for now   Subjective: Awake alert coherent no distress eating drinking no focal deficit States the pain is now a dull pain in the center of the abdomen without any alarm factors no vomiting thinks that he can eat was tolerating ice chips earlier today Consultants:   Gastroenterology  General surgery  Objective: Vitals:   04/03/19 2152 04/04/19 0423 04/04/19 0938 04/04/19 1037  BP: (!) 155/101 (!) 160/106  (!) 150/92  Pulse: (!) 109 100    Resp: _0 Temp: 98.8 F (37.1 C) 98.2 F (36.8 C)    TempSrc: Oral Oral    SpO2: 96% 94%    Weight:      Height:        Intake/Output Summary (Last 24 hours) at 04/04/2019 1053 Last data filed at 04/04/2019 0940 Gross per 24 hour  Intake 3238.78 ml  Output 2000 ml  Net 1238.78 ml   Filed Weights   04/03/19 1018  Weight: 101.2 kg    Examination: EOMI NCAT thick neck Mallampati 4 black male CTA B no added sound no rales no rhonchi ROM intact no focal deficit to power S1-S2 no murmur rub or gallop No lower extremity edema   Scheduled Meds: . pantoprazole (PROTONIX) IV  40 mg Intravenous Q12H   Continuous Infusions: . sodium chloride 150 mL/hr at 04/04/19 1045     LOS: 1 day   Time spent: [place], MD [facility]  04/04/2019, 10:53 AM

## 2019-04-04 NOTE — Progress Notes (Addendum)
I have seen and evaluated him this morning as well. Multiple studies including [place] and MRCP have not shown gallstones. Does have pancreatitis. Do not think that in this setting would recommend cholecystectomy unless compelling evidence for biliary "sand" or tiny gallstones found on additional study  Marin Olp, M.D. [facility] Use [email or web address] to contact on call provider

## 2019-04-05 ENCOUNTER — Encounter (HOSPITAL_COMMUNITY): Payer: Self-pay | Admitting: Internal Medicine

## 2019-04-05 ENCOUNTER — Inpatient Hospital Stay (HOSPITAL_COMMUNITY): Payer: BLUE CROSS/BLUE SHIELD

## 2019-04-05 DIAGNOSIS — I10 Essential (primary) hypertension: Secondary | ICD-10-CM

## 2019-04-05 DIAGNOSIS — I5032 Chronic diastolic (congestive) heart failure: Secondary | ICD-10-CM

## 2019-04-05 DIAGNOSIS — N179 Acute kidney failure, unspecified: Secondary | ICD-10-CM

## 2019-04-05 LAB — COMPREHENSIVE METABOLIC PANEL
ALT: 324 U/L — ABNORMAL HIGH (ref 0–44)
AST: 86 U/L — ABNORMAL HIGH (ref 15–41)
Albumin: 3.3 g/dL — ABNORMAL LOW (ref 3.5–5.0)
Alkaline Phosphatase: 248 U/L — ABNORMAL HIGH (ref 38–126)
Anion gap: 10 (ref 5–15)
BUN: 10 mg/dL (ref 6–20)
CO2: 22 mmol/L (ref 22–32)
Calcium: 8.3 mg/dL — ABNORMAL LOW (ref 8.9–10.3)
Chloride: 104 mmol/L (ref 98–111)
Creatinine, Ser: 1.46 mg/dL — ABNORMAL HIGH (ref 0.61–1.24)
GFR calc Af Amer: >60 mL/min (ref >60)
GFR calc non Af Amer: 54 mL/min — ABNORMAL LOW (ref >60)
Glucose, Bld: 96 mg/dL (ref 70–99)
Potassium: 4 mmol/L (ref 3.5–5.1)
Sodium: 136 mmol/L (ref 135–145)
Total Bilirubin: 1.6 mg/dL — ABNORMAL HIGH (ref 0.3–1.2)
Total Protein: 6.9 g/dL (ref 6.5–8.1)

## 2019-04-05 LAB — CERULOPLASMIN: Ceruloplasmin: 30.2 mg/dL (ref 16.0–31.0)

## 2019-04-05 LAB — AFP TUMOR MARKER: AFP, Serum, Tumor Marker: 3.5 ng/mL (ref 0.0–8.3)

## 2019-04-05 LAB — ALPHA-1-ANTITRYPSIN: A-1 Antitrypsin, Ser: 155 mg/dL (ref 101–187)

## 2019-04-05 LAB — HCV AB W REFLEX TO QUANT PCR: HCV Ab: <0.1 s/co ratio (ref 0.0–0.9)

## 2019-04-05 LAB — HCV INTERPRETATION

## 2019-04-05 MED ORDER — ACETAMINOPHEN 325 MG PO TABS
650.0000 mg | ORAL_TABLET | Freq: Four times a day (QID) | ORAL | Status: DC | PRN
  Administered 2019-04-05 – 2019-04-10 (×3): 650 mg via ORAL
  Filled 2019-04-05 (×4): qty 2

## 2019-04-05 MED ORDER — LACTATED RINGERS IV SOLN: INTRAVENOUS | Status: DC

## 2019-04-05 MED ORDER — HYDROMORPHONE HCL 1 MG/ML IJ SOLN
1.0000 mg | INTRAMUSCULAR | Status: DC | PRN
  Administered 2019-04-05 – 2019-04-09 (×18): 1 mg via INTRAVENOUS
  Filled 2019-04-05 (×20): qty 1

## 2019-04-05 NOTE — Progress Notes (Signed)
Subjective: Continues to have abdominal pain but able to tolerate clear liquid diet. Denies bowel movement but is passing flatus. Able to ambulate in the hallways.  Objective: Vital signs in last 24 hours: Temp:  [98.3 F (36.8 C)-101.1 F (38.4 C)] 100.3 F (37.9 C) (03/13 1335) Pulse Rate:  [99-139] 115 (03/13 1335) Resp:  [18-20] 20 (03/13 1335) BP: (130-147)/(89-99) 142/97 (03/13 1335) SpO2:  [87 %-100 %] 95 % (03/13 1335) Weight change:  Last BM Date: 04/03/19  PE: Mild respiratory distress, on oxygen by nasal cannula GENERAL: Decreased breath sound at bilateral bases ABDOMEN: Distended, mild epigastric tenderness, hypoactive bowel sounds EXTREMITIES: No edema, no deformity  Lab Results: Results for orders placed or performed during the hospital encounter of 04/03/19 (from the past 48 hour(s))  HIV Antibody (routine testing w rflx)     Status: None   Collection Time: 04/03/19  8:41 PM  Result Value Ref Range   HIV Screen 4th Generation wRfx NON REACTIVE NON REACTIVE    Comment: Performed at [facility], [address] [place], [place] [address]  Hepatitis B surface antigen     Status: None   Collection Time: 04/03/19  8:41 PM  Result Value Ref Range   Hepatitis B Surface Ag NON REACTIVE NON REACTIVE    Comment: Performed at [facility], [address] [place], [place] [address]  Hepatitis B core antibody, IgM     Status: None   Collection Time: 04/03/19  8:41 PM  Result Value Ref Range   Hep B C IgM NON REACTIVE NON REACTIVE    Comment: Performed at [facility], [address] [place], [place] [address]  HCV Ab Reflex to Quant PCR     Status: None   Collection Time: 04/03/19  8:41 PM  Result Value Ref Range   HCV Ab <0.1 0.0 - 0.9 s/co ratio    Comment: (NOTE) Performed At: [facility] [address] [place], [place] [id number] Jolene Schimke MD [id number]   Hepatitis A antibody, IgM     Status: None   Collection Time:  04/03/19  8:41 PM  Result Value Ref Range   Hep A IgM NON REACTIVE NON REACTIVE    Comment: Performed at [facility], [address] [place], [place] [address]  Ceruloplasmin     Status: None   Collection Time: 04/03/19  8:41 PM  Result Value Ref Range   Ceruloplasmin 30.2 16.0 - 31.0 mg/dL    Comment: (NOTE) Performed At: [facility] [address] [place], [place] [id number] Jolene Schimke MD [id number]   Alpha-1-antitrypsin     Status: None   Collection Time: 04/03/19  8:41 PM  Result Value Ref Range   A-1 Antitrypsin, Ser 155 101 - 187 mg/dL    Comment: (NOTE) Performed At: [facility] [address] [place], [place] [id number] Jolene Schimke MD [id number]   Ferritin     Status: None   Collection Time: 04/03/19  8:41 PM  Result Value Ref Range   Ferritin 260 24 - 336 ng/mL    Comment: Performed at [facility], [address] [place], [place] [address]  Protime-INR     Status: None   Collection Time: 04/03/19  8:41 PM  Result Value Ref Range   Prothrombin Time 13.1 11.4 - 15.2 seconds   INR 1.0 0.8 - 1.2    Comment: (NOTE) INR goal varies based on device and disease states. Performed at [facility], [place] [address] [place], [place] [address]   Troponin I (High Sensitivity)     Status: None   Collection Time: 04/03/19  8:41 PM  Result Value Ref Range   Troponin I (High Sensitivity) 6 <18 ng/L    Comment: (NOTE) Elevated high sensitivity troponin I (hsTnI) values and significant  changes across serial measurements may suggest ACS but many other  chronic and acute conditions are known to elevate hsTnI results.  Refer to the "Links" section for chest pain algorithms and additional  guidance. Performed at [facility], [place] [address], [place], [place] [address]   Interpretation:     Status: None   Collection Time: 04/03/19  8:41 PM  Result Value Ref Range   HCV Interp 1: Comment     Comment: (NOTE) Negative Not  infected with HCV, unless recent infection is suspected or other evidence exists to indicate HCV infection. Performed At: [facility] [place], [place] [id number] Rush Farmer MD [id number]   Comprehensive metabolic panel     Status: Abnormal   Collection Time: 04/04/19  2:48 AM  Result Value Ref Range   Sodium 144 135 - 145 mmol/L   Potassium 4.3 3.5 - 5.1 mmol/L   Chloride 106 98 - 111 mmol/L   CO2 26 22 - 32 mmol/L   Glucose, Bld 106 (H) 70 - 99 mg/dL    Comment: Glucose reference range applies only to samples taken after fasting for at least 8 hours.   BUN 10 6 - 20 mg/dL   Creatinine, Ser 1.60 (H) 0.61 - 1.24 mg/dL   Calcium 8.9 8.9 - 10.3 mg/dL   Total Protein 7.1 6.5 - 8.1 g/dL   Albumin 3.4 (L) 3.5 - 5.0 g/dL   AST 512 (H) 15 - 41 U/L   ALT 754 (H) 0 - 44 U/L   Alkaline Phosphatase 344 (H) 38 - 126 U/L   Total Bilirubin 2.1 (H) 0.3 - 1.2 mg/dL   GFR calc non Af Amer 48 (L) >60 mL/min   GFR calc Af Amer 56 (L) >60 mL/min   Anion gap 12 5 - 15    Comment: Performed at [place] [address], [place] [address]  CBC     Status: None   Collection Time: 04/04/19  2:48 AM  Result Value Ref Range   WBC 7.1 4.0 - 10.5 K/uL   RBC 5.10 4.22 - 5.81 MIL/uL   Hemoglobin 15.8 13.0 - 17.0 g/dL   HCT 48.3 39.0 - 52.0 %   MCV 94.7 80.0 - 100.0 fL   MCH 31.0 26.0 - 34.0 pg   MCHC 32.7 30.0 - 36.0 g/dL   RDW 13.8 11.5 - 15.5 %   Platelets 217 150 - 400 K/uL   nRBC 0.0 0.0 - 0.2 %    Comment: Performed at [facility], [place] [address] [place], [place] [address]  Protime-INR     Status: None   Collection Time: 04/04/19  2:48 AM  Result Value Ref Range   Prothrombin Time 13.7 11.4 - 15.2 seconds   INR 1.1 0.8 - 1.2    Comment: (NOTE) INR goal varies based on device and disease states. Performed at [facility], [place] [address], [place], [place] [address]   APTT     Status: None   Collection Time: 04/04/19  2:48 AM   Result Value Ref Range   aPTT 26 24 - 36  seconds    Comment: Performed at [facility], [address] [place], [place] [address]  AFP tumor marker     Status: None   Collection Time: 04/04/19  2:48 AM  Result Value Ref Range   AFP, Serum, Tumor Marker 3.5 0.0 - 8.3 ng/mL    Comment: (NOTE) [employer] Electrochemiluminescence Immunoassay (ECLIA) Values obtained with different assay methods or kits cannot be used interchangeably.  Results cannot be interpreted as absolute evidence of the presence or absence of malignant disease. This test is not interpretable in pregnant females. Performed At: [facility] [address] [place], [place] [id number] Jolene Schimke MD AS:[id number]   Lipid panel     Status: Abnormal   Collection Time: 04/04/19 11:00 AM  Result Value Ref Range   Cholesterol 246 (H) 0 - 200 mg/dL   Triglycerides 73 <989 mg/dL   HDL 35 (L) >21 mg/dL   Total CHOL/HDL Ratio 7.0 RATIO   VLDL 15 0 - 40 mg/dL   LDL Cholesterol 194 (H) 0 - 99 mg/dL    Comment:        Total Cholesterol/HDL:CHD Risk Coronary Heart Disease Risk Table                     Men   Women  1/2 Average Risk   3.4   3.3  Average Risk       5.0   4.4  2 X Average Risk   9.6   7.1  3 X Average Risk  23.4   11.0        Use the calculated Patient Ratio above and the CHD Risk Table to determine the patient's CHD Risk.        ATP III CLASSIFICATION (LDL):  <100     mg/dL   Optimal  174-081  mg/dL   Near or Above                    Optimal  130-159  mg/dL   Borderline  448-185  mg/dL   High  >631     mg/dL   Very High Performed at [facility], [address], [place], [place] [address]   Comprehensive metabolic panel     Status: Abnormal   Collection Time: 04/05/19  2:27 PM  Result Value Ref Range   Sodium 136 135 - 145 mmol/L    Comment: DELTA CHECK NOTED   Potassium 4.0 3.5 - 5.1 mmol/L   Chloride 104 98 - 111 mmol/L   CO2 22 22 - 32 mmol/L   Glucose, Bld 96  70 - 99 mg/dL    Comment: Glucose reference range applies only to samples taken after fasting for at least 8 hours.   BUN 10 6 - 20 mg/dL   Creatinine, Ser 6.37 (H) 0.61 - 1.24 mg/dL   Calcium 8.3 (L) 8.9 - 10.3 mg/dL   Total Protein 6.9 6.5 - 8.1 g/dL   Albumin 3.3 (L) 3.5 - 5.0 g/dL   AST 86 (H) 15 - 41 U/L   ALT 324 (H) 0 - 44 U/L   Alkaline Phosphatase 248 (H) 38 - 126 U/L   Total Bilirubin 1.6 (H) 0.3 - 1.2 mg/dL   GFR calc non Af Amer 54 (L) >60 mL/min   GFR calc Af Amer >60 >60 mL/min   Anion gap 10 5 - 15    Comment: Performed at [facility], [address] [place], [place]  [address]    Studies/Results: MR ABDOMEN MRCP WO CONTRAST  Result Date: 04/03/2019 CLINICAL DATA:  Elevated lipase, possible pancreatitis EXAM: MRI ABDOMEN WITHOUT CONTRAST  (INCLUDING MRCP) TECHNIQUE: Multiplanar multisequence MR imaging of the abdomen was performed. Heavily T2-weighted images of the biliary and pancreatic ducts were obtained, and three-dimensional MRCP images were rendered by post processing. COMPARISON:  Ultrasound of the abdomen from earlier in the same day. FINDINGS: Lower chest: Within normal limits. Hepatobiliary: Liver demonstrates a cyst adjacent to the fundus of the gallbladder measuring 14 mm. No focal mass lesion is noted. No biliary ductal dilatation is seen. No definitive filling defects are noted within the common bile duct. The gallbladder is partially distended. No definitive gallstones are seen. Pancreas: Pancreas is well visualized without focal mass. Peripancreatic inflammatory changes and edema are noted which extend along the lateral aspect of Gerota's fascia on the left as well as surround the second portion of the duodenum extending inferiorly. Spleen:  Spleen is well visualized and within normal limits. Adrenals/Urinary Tract: Adrenal glands are unremarkable. Right kidney shows no obstructive changes. Left kidney demonstrates a septated cyst in the lower pole as well as  a parapelvic cyst. No obstructive changes are seen. The bladder is not visualized on this exam. Stomach/Bowel: Visualized bowel structures show no acute abnormality. Vascular/Lymphatic: No aneurysmal dilatation is seen. No significant lymphadenopathy is noted. Other:  No free fluid is seen. Musculoskeletal: Bony structures appear within normal limits. IMPRESSION: No evidence of cholelithiasis or biliary ductal dilatation. Changes consistent with pancreatitis with surrounding edema as described. No pancreatic mass is seen. Hepatic cyst and renal cysts as described. Electronically Signed   By: Alcide Clever M.D.   On: 04/03/2019 21:13    Medications: I have reviewed the patient's current medications.  Assessment: Acute pancreatitis-unclear etiology, no gallstones, history of alcohol use, triglycerides Incidental diagnosis of cirrhosis(viral serology for hepatitis B and hepatitis C negative, normal alpha-1 antitrypsin, normal alpha-fetoprotein), no encephalopathy and no coagulopathy  Liver enzymes trending down, T bili 1.6/AST 86/ALT 324/ALP 248?  Ischemic hepatitis, as liver enzymes are rapidly trending down  Renal function has normalized, BUN 10, creatinine 1.46, GFR 60 Obesity, diastolic heart failure, hypertension  Plan: Patient tolerating clear liquid diet and does not want his diet to be advanced further at the moment. IV fluids has been decreased to 75 mL/h. Serology markers for ASMA and AMA are pending. Continue supportive management, adequate pain control, early aggressive ambulation Continue Protonix 40 mg IV twice daily  Kerin Salen, MD 04/05/2019, 5:04 PM

## 2019-04-05 NOTE — Progress Notes (Signed)
Placed patient on CPAP for the night via auto-mode.  

## 2019-04-05 NOTE — Progress Notes (Addendum)
PROGRESS NOTE    Todd Buchanan  KXF:[id number] DOB: 11-18-1965 DOA: 04/03/2019 PCP: Patient, No Pcp Per    Brief Narrative:  Patient admitted to the hospital with working diagnosis of acute pancreatitis complicated by acute kidney injury and hepatic injury  54 year old male who presented with abdominal pain.  He does have significant past medical history for diastolic heart failure.  He reported intermittent abdominal pain for last 2 months, associated with meals.  The night prior to hospitalization patient developed severe episode of pain, colicky in nature, 69-67 intensity associated with vomiting.  On his initial physical examination his blood pressure was 149/112, heart rate 85, respiratory 23, oxygen saturation 92%, he had dry mucous membranes, his lungs were clear to auscultation bilaterally, heart S1-S2 present rhythmic, soft abdomen, tender to palpation right upper quadrant, no lower extremity edema.  Sodium 140, potassium 4.0, chloride 103, bicarb 26, glucose 174, BUN 13, creatinine 1.81, albumin 4.1, lipase 6430, AST 1068, ALT 920, total bilirubin is 3.8, GGT 1145, triglycerides 73, white count 9.7, hemoglobin 17.4, hematocrit 52.8, platelets 244. INR 1.1.  SARS COVID-19 was negative, urinalysis negative for infection, alcohol less than 10.  Abdominal ultrasound with no evidence of gallstones or acute cholecystitis, common bile duct upper limits of normal, 7 mm diameter.  Mild left renal hydronephrosis.   MRCP with no evidence of cholelithiasis or biliary duct dilatation.  Positive peripancreatic inflammation, which extended along the lateral aspect of Gerota's fascia as well as around the second portion of the duodenum. EKG 86 bpm, left axis deviation, first-degree AV block, right bundle branch block, sinus rhythm, no significant ST segment or T wave changes.  Patient has been placed on supportive medical therapy with good toleration.   Assessment & Plan:   Active Problems:  Pancreatitis due to biliary obstruction   Acute pancreatitis   1. Acute pancreatitis/ hepatitis. Patient continue to have abdominal pain, continue to be tachycardic (115 bpm), T max 37,9 and today with dyspnea. Wbc on 03/12 7,1. No encephalopathy or asterixis. LFT trending down with ast 86 and alt 324, T bil 1,6. Viral hepatitis negative.   Will continue supportive medical care with IV fluids (balanced electrolyte solutions) and IV analgesics (will increase hydromorphone to 1 mg as needed), continue clear liquid diet, as needed antiemetics. Continue antiacid therapy. Out of bed to chair tid with meals as tolerated. Will follow lipase in am.   Will follow chest film to rule out non cardiogenic pulmonary edema.   Patient continue to be at high risk for worsening pancreatitis and systemic inflammation. If no clinical improvement will consider repeat abdominal ct in 24 to 48 H. Will change level of care to medical telemetry.  2. Diastolic heart failure. No signs of acute exacerbation, will continue gentle hydration with balanced electrolytes solutions, continue blood pressure monitoring.   3. HTN. Blood pressure continue to be controlled. Will hold on amlodipine for now due to risk of hypotension.   4. GERD. Continue with pantoprazole IV  5. AKI. Renal function with serum cr at 1,46 with K at 4,0 and serum bicarbonate at 22 with Cl at 104. Urine output 1,200 ml over last 24h.   Will change fluids to balanced electrolyte solutions at 75 ml/ H. Follow on chest film. Follow strict in and out.   6. Obesity class 2. BMI calculated 32, will need outpatient follow up.    DVT prophylaxis: enoxaparin   Code Status: full Family Communication: no family at the beside  Disposition Plan/ discharge  barriers: patient from home, barrier for dc severe pancreatitis, needing inpatient care.     Subjective:  Patient continue to have abdominal pain, worse with movement and touch, still able to tolerate  clear, with no nausea or vomiting, no chest pain, but today with dyspnea.   Objective: Vitals:   04/05/19 0159 04/05/19 0516 04/05/19 1021 04/05/19 1335  BP: (!) 131/94 (!) 130/97 (!) 136/92 (!) 142/97  Pulse: (!) 103 99 (!) 113 (!) 115  Resp: 20 20 20 20   Temp: 99.2 F (37.3 C) 99.2 F (37.3 C) 100 F (37.8 C) 100.3 F (37.9 C)  TempSrc: Oral Oral Oral Oral  SpO2: 95% 100% 96% 95%  Weight:      Height:        Intake/Output Summary (Last 24 hours) at 04/05/2019 1353 Last data filed at 04/05/2019 1000 Gross per 24 hour  Intake 3211.17 ml  Output 2050 ml  Net 1161.17 ml   Filed Weights   04/03/19 1018  Weight: 101.2 kg    Examination:   General: positive  pain and dyspnea, at rest,  deconditioned and ill looking appearing  Neurology: Awake and alert, non focal  E ENT: mild pallor, no icterus, oral mucosa moist Cardiovascular: No JVD. S1-S2 present, rhythmic tachycardic, no gallops, rubs, or murmurs. No lower extremity edema. Pulmonary: positive breath sounds bilaterally, adequate air movement, no wheezing, rhonchi or rales. Gastrointestinal. Abdomen with  no organomegaly, non tender, no rebound or guarding Skin. No rashes Musculoskeletal: no joint deformities     Data Reviewed: I have personally reviewed following labs and imaging studies  CBC: Recent Labs  Lab 04/03/19 1041 04/04/19 0248  WBC 9.7 7.1  HGB 17.4* 15.8  HCT 52.8* 48.3  MCV 94.3 94.7  PLT 244 217   Basic Metabolic Panel: Recent Labs  Lab 04/03/19 1041 04/04/19 0248  NA 140 144  K 4.0 4.3  CL 103 106  CO2 26 26  GLUCOSE 174* 106*  BUN 13 10  CREATININE 1.81* 1.60*  CALCIUM 10.1 8.9   GFR: Estimated Creatinine Clearance: 63.7 mL/min (A) (by C-G formula based on SCr of 1.6 mg/dL (H)). Liver Function Tests: Recent Labs  Lab 04/03/19 1041 04/04/19 0248  AST 1,168* 512*  ALT 920* 754*  ALKPHOS 384* 344*  BILITOT 3.8* 2.1*  PROT 8.3* 7.1  ALBUMIN 4.1 3.4*   Recent Labs  Lab  04/03/19 1041  LIPASE 6,430*   No results for input(s): AMMONIA in the last 168 hours. Coagulation Profile: Recent Labs  Lab 04/03/19 2041 04/04/19 0248  INR 1.0 1.1   Cardiac Enzymes: No results for input(s): CKTOTAL, CKMB, CKMBINDEX, TROPONINI in the last 168 hours. BNP (last 3 results) No results for input(s): PROBNP in the last 8760 hours. HbA1C: No results for input(s): HGBA1C in the last 72 hours. CBG: No results for input(s): GLUCAP in the last 168 hours. Lipid Profile: Recent Labs    04/04/19 1100  CHOL 246*  HDL 35*  LDLCALC 196*  TRIG 73  CHOLHDL 7.0   Thyroid Function Tests: No results for input(s): TSH, T4TOTAL, FREET4, T3FREE, THYROIDAB in the last 72 hours. Anemia Panel: Recent Labs    04/03/19 2041  FERRITIN 260      Radiology Studies: I have reviewed all of the imaging during this hospital visit personally     Scheduled Meds: . amLODipine  10 mg Oral Daily  . pantoprazole (PROTONIX) IV  40 mg Intravenous Q12H   Continuous Infusions: . sodium chloride 150 mL/hr at  04/05/19 0723     LOS: 2 days        Todd Buchanan Annett Gula, MD

## 2019-04-05 NOTE — Progress Notes (Signed)
Patient on Yellow MEWS d/t PR between 130-139 sustained, not anxious, pain at 3/10.  Text paged night coverage [facility]. And Dr. Linton Flemings ordered Metoprolol 5mg  IV Q59min. For sustained HR>120 for 2 doses.  Administered 5mg  Metoprolol IV 2319 and recheck PR=108 at 2347. Will monitor.

## 2019-04-06 LAB — CBC WITH DIFFERENTIAL/PLATELET
Abs Immature Granulocytes: 0.03 10*3/uL (ref 0.00–0.07)
Basophils Absolute: 0 10*3/uL (ref 0.0–0.1)
Basophils Relative: 0 %
Eosinophils Absolute: 0 10*3/uL (ref 0.0–0.5)
Eosinophils Relative: 0 %
HCT: 44.8 % (ref 39.0–52.0)
Hemoglobin: 14.7 g/dL (ref 13.0–17.0)
Immature Granulocytes: 0 %
Lymphocytes Relative: 15 %
Lymphs Abs: 1.5 10*3/uL (ref 0.7–4.0)
MCH: 31.1 pg (ref 26.0–34.0)
MCHC: 32.8 g/dL (ref 30.0–36.0)
MCV: 94.7 fL (ref 80.0–100.0)
Monocytes Absolute: 0.7 10*3/uL (ref 0.1–1.0)
Monocytes Relative: 7 %
Neutro Abs: 7.5 10*3/uL (ref 1.7–7.7)
Neutrophils Relative %: 78 %
Platelets: 174 10*3/uL (ref 150–400)
RBC: 4.73 MIL/uL (ref 4.22–5.81)
RDW: 13.9 % (ref 11.5–15.5)
WBC: 9.7 10*3/uL (ref 4.0–10.5)
nRBC: 0 % (ref 0.0–0.2)

## 2019-04-06 LAB — LIPASE, BLOOD: Lipase: 76 U/L — ABNORMAL HIGH (ref 11–51)

## 2019-04-06 LAB — COMPREHENSIVE METABOLIC PANEL
ALT: 243 U/L — ABNORMAL HIGH (ref 0–44)
AST: 51 U/L — ABNORMAL HIGH (ref 15–41)
Albumin: 2.9 g/dL — ABNORMAL LOW (ref 3.5–5.0)
Alkaline Phosphatase: 200 U/L — ABNORMAL HIGH (ref 38–126)
Anion gap: 9 (ref 5–15)
BUN: 8 mg/dL (ref 6–20)
CO2: 25 mmol/L (ref 22–32)
Calcium: 8.3 mg/dL — ABNORMAL LOW (ref 8.9–10.3)
Chloride: 102 mmol/L (ref 98–111)
Creatinine, Ser: 1.4 mg/dL — ABNORMAL HIGH (ref 0.61–1.24)
GFR calc Af Amer: >60 mL/min (ref >60)
GFR calc non Af Amer: 57 mL/min — ABNORMAL LOW (ref >60)
Glucose, Bld: 108 mg/dL — ABNORMAL HIGH (ref 70–99)
Potassium: 3.8 mmol/L (ref 3.5–5.1)
Sodium: 136 mmol/L (ref 135–145)
Total Bilirubin: 1.8 mg/dL — ABNORMAL HIGH (ref 0.3–1.2)
Total Protein: 6.6 g/dL (ref 6.5–8.1)

## 2019-04-06 MED ORDER — METOPROLOL TARTRATE 5 MG/5ML IV SOLN
5.0000 mg | INTRAVENOUS | Status: DC | PRN
  Filled 2019-04-06 (×3): qty 5

## 2019-04-06 MED ORDER — POLYETHYLENE GLYCOL 3350 17 G PO PACK
17.0000 g | PACK | Freq: Every day | ORAL | Status: DC
  Administered 2019-04-06 – 2019-04-10 (×4): 17 g via ORAL
  Filled 2019-04-06 (×4): qty 1

## 2019-04-06 NOTE — Progress Notes (Signed)
PROGRESS NOTE    Todd Buchanan  YQI:[id number] DOB: 06/17/1965 DOA: 04/03/2019 PCP: Todd Buchanan, No Pcp Per    Brief Narrative:  Todd Buchanan admitted to the hospital with working diagnosis of acute pancreatitis complicated by acute kidney injury and acute hepatic injury  54 year old male who presented with abdominal pain.  He does have significant past medical history for diastolic heart failure.  He reported intermittent abdominal pain for last 2 months, associated with meals.  The night prior to hospitalization Todd Buchanan developed severe episode of pain, colicky in nature, 10-10 intensity associated with vomiting.  On his initial physical examination his blood pressure was 149/112, heart rate 85, respiratory 23, oxygen saturation 92%, he had dry mucous membranes, his lungs were clear to auscultation bilaterally, heart S1-S2 present rhythmic, soft abdomen, tender to palpation right upper quadrant, no lower extremity edema.  Sodium 140, potassium 4.0, chloride 103, bicarb 26, glucose 174, BUN 13, creatinine 1.81, albumin 4.1, lipase 6430, AST 1068, ALT 920, total bilirubin is 3.8, GGT 1145, triglycerides 73, white count 9.7, hemoglobin 17.4, hematocrit 52.8, platelets 244. INR 1.1.  SARS COVID-19 was negative, urinalysis negative for infection, alcohol less than 10.  Abdominal ultrasound with no evidence of gallstones or acute cholecystitis, common bile duct upper limits of normal, 7 mm diameter.  Mild left renal hydronephrosis.    MRCP with no evidence of cholelithiasis or biliary duct dilatation.  Positive peripancreatic inflammation, which extended along the lateral aspect of Gerota's fascia as well as around the second portion of the duodenum. EKG 86 bpm, left axis deviation, first-degree AV block, right bundle branch block, sinus rhythm, no significant ST segment or T wave changes.  Todd Buchanan has been placed on supportive medical therapy with good toleration.   Assessment & Plan:   Active  Problems:   Pancreatitis due to biliary obstruction   Acute pancreatitis   Chronic diastolic CHF (congestive heart failure) (HCC)   Essential hypertension   AKI (acute kidney injury) (HCC)   1. Acute pancreatitis/ hepatitis. Improved abdominal pain but not yet back to baseline. HR has decreased to 103 to 110 bpm, T max 101 F yesterday PM.  WBC is 9,7. AST and ALT continue to trend down 51 and 243, respectively. T Bil at 1,8. Follow up chest film, with no signs of pulmonary edema, this am oxygenation is 92% on room air.   Continue with balanced electrolyte solutions, at 50 ml per H, as needed IV hydromorphone and zofran. Todd Buchanan has been out of bed to chair and ambulating.   Will continue with clear liquids today with possible advance to full liquids in am.   Continue pantoprazole and will add bowel regimen. Ok to use acetaminophen for fever control.    2. Diastolic heart failure. No acute signs of acute exacerbation, continue gentle hydration with LR at 50 ml per H.  3. HTN. Blood pressure 148/99, will continue holding amlodipine for now, possible resume in am, if continue clinical improvement.   4. GERD. On pantoprazole, will change to po.   5. AKI. This am serum cr down to 1,40 with  K at 3,8 and serum bicarbonate at 25 with Cl at 102. Urine output 1,850 ml over last 24h.   Continue hydration with LR at 50 ml per H.   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     DVT prophylaxis: enoxaparin   Code Status: full Family Communication: no family at the beside  Disposition Plan/ discharge barriers: Todd Buchanan from home, barrier for dc severe  pancreatitis, needing inpatient care.     Consultants:   GI   Subjective: Todd Buchanan is feeling better, continue to have abdominal pain but improved in intensity, no nausea or vomiting. Continue to have pain when having clears by mouth. Dyspnea has improved. He had fever yesterday.   Objective: Vitals:   04/05/19 2105 04/05/19 2222  04/06/19 0121 04/06/19 0627  BP: 136/90  (!) 147/98 (!) 142/99  Pulse: (!) 103 75 (!) 109 (!) 108  Resp: 17 17 16 18   Temp: 98.7 F (37.1 C)  99.7 F (37.6 C) 98.4 F (36.9 C)  TempSrc: Oral  Oral Oral  SpO2: 95% 94% 95% 93%  Weight:      Height:        Intake/Output Summary (Last 24 hours) at 04/06/2019 0902 Last data filed at 04/06/2019 0449 Gross per 24 hour  Intake 2615.76 ml  Output 1850 ml  Net 765.76 ml   Filed Weights   04/03/19 1018  Weight: 101.2 kg    Examination:   General: deconditioned  Neurology: Awake and alert, non focal  E ENT: no pallor, no icterus, oral mucosa moist Cardiovascular: No JVD. S1-S2 present, rhythmic, no gallops, rubs, or murmurs. No lower extremity edema. Pulmonary: positive breath sounds bilaterally, adequate air movement, no wheezing, rhonchi or rales. Gastrointestinal. Abdomen mild distended, tender to deep palpation , no organomegaly, no rebound or guarding Skin. No rashes Musculoskeletal: no joint deformities     Data Reviewed: I have personally reviewed following labs and imaging studies  CBC: Recent Labs  Lab 04/03/19 1041 04/04/19 0248 04/06/19 0455  WBC 9.7 7.1 9.7  NEUTROABS  --   --  7.5  HGB 17.4* 15.8 14.7  HCT 52.8* 48.3 44.8  MCV 94.3 94.7 94.7  PLT 244 217 242   Basic Metabolic Panel: Recent Labs  Lab 04/03/19 1041 04/04/19 0248 04/05/19 1427 04/06/19 0455  NA 140 144 136 136  K 4.0 4.3 4.0 3.8  CL 103 106 104 102  CO2 26 26 22 25   GLUCOSE 174* 106* 96 108*  BUN 13 10 10 8   CREATININE 1.81* 1.60* 1.46* 1.40*  CALCIUM 10.1 8.9 8.3* 8.3*   GFR: Estimated Creatinine Clearance: 72.8 mL/min (A) (by C-G formula based on SCr of 1.4 mg/dL (H)). Liver Function Tests: Recent Labs  Lab 04/03/19 1041 04/04/19 0248 04/05/19 1427 04/06/19 0455  AST 1,168* 512* 86* 51*  ALT 920* 754* 324* 243*  ALKPHOS 384* 344* 248* 200*  BILITOT 3.8* 2.1* 1.6* 1.8*  PROT 8.3* 7.1 6.9 6.6  ALBUMIN 4.1 3.4* 3.3* 2.9*    Recent Labs  Lab 04/03/19 1041 04/06/19 0455  LIPASE 6,430* 76*   No results for input(s): AMMONIA in the last 168 hours. Coagulation Profile: Recent Labs  Lab 04/03/19 2041 04/04/19 0248  INR 1.0 1.1   Cardiac Enzymes: No results for input(s): CKTOTAL, CKMB, CKMBINDEX, TROPONINI in the last 168 hours. BNP (last 3 results) No results for input(s): PROBNP in the last 8760 hours. HbA1C: No results for input(s): HGBA1C in the last 72 hours. CBG: No results for input(s): GLUCAP in the last 168 hours. Lipid Profile: Recent Labs    04/04/19 1100  CHOL 246*  HDL 35*  LDLCALC 196*  TRIG 73  CHOLHDL 7.0   Thyroid Function Tests: No results for input(s): TSH, T4TOTAL, FREET4, T3FREE, THYROIDAB in the last 72 hours. Anemia Panel: Recent Labs    04/03/19 2041  FERRITIN 260      Radiology Studies: I have reviewed  all of the imaging during this hospital visit personally     Scheduled Meds: . pantoprazole (PROTONIX) IV  40 mg Intravenous Q12H   Continuous Infusions: . lactated ringers 75 mL/hr at 04/06/19 0548     LOS: 3 days        Tammye Kahler Annett Gula, MD

## 2019-04-06 NOTE — Progress Notes (Signed)
Occupational Therapy Evaluation Only Patient Details Name: Todd Buchanan MRN: [id number] DOB: 1965/11/23 Today's Date: 04/06/2019    History of Present Illness Todd Buchanan is a 54 y.o. male with medical history significant of remote Hx of CHF (no longer needing medications) presented with on and off abdominal pain.   Symptoms started about 2 months ago, associated with meals, time also had pain at night.   Denied any feeling of nauseous vomiting, diarrhea, no weight loss.   Given to see a local GI specialist in [facility], symptomatic gallstone was suspected, and he was told to return the hospital if he had any worsening of his symptoms.  Last night patient had a severe episode of abdominal pain, cramping like a 10 out of 10, with 1 episode of NBNB vomiting.  Then he has vomited multiple times over night and this morning.    Also mentioned that his stool color has been lighter since last month, and urine color has turned darker.  He started to feel episode of chills this morning.  Denied any chest pain, no short of breath.    Clinical Impression   PTA pt lived alone, independent in all ADLs and IADLs. Pt ambulates without an assistive device and reports 0 falls in the last 6 months. Pt still drives and works as a Conservator, museum/gallery. Pt able to ambulate around room and to/from bathroom independently without an assistive device. Pt completed LB dressing, toileting, and hygiene at the sink independently. Simulated tub/shower transfer in room with pt able to complete independently. Noted 0 instances of LOB throughout. Educated pt on safety strategies, fall prevention, energy conservation, and DME options with good understanding and follow through. Pt functioning near baseline for self-care and mobility tasks with skilled OT services not warranted at this time.     Follow Up Recommendations  No OT follow up;Supervision - Intermittent    Equipment Recommendations  Tub/shower seat     Recommendations for Other Services       Precautions / Restrictions Precautions Precautions: None Restrictions Weight Bearing Restrictions: No      Mobility Bed Mobility Overal bed mobility: Modified Independent             General bed mobility comments: HOB elevated, use of bed rails  Transfers Overall transfer level: Independent Equipment used: None             General transfer comment: Pt able to ambulate around room and to/from bathroom independently. Pt completed toilet transfer and simulated tub/shower transfer independently. Noted 0 instances of LOB.     Balance Overall balance assessment: No apparent balance deficits (not formally assessed)                                         ADL either performed or assessed with clinical judgement   ADL Overall ADL's : At baseline;Independent                                       General ADL Comments: Pt able to complete ADLs independently while safely managing IV pole. Simulated tub/shower transfer with pt able to complete independently. Pt ambulated around room independently with 0 instances of LOB.      Vision Baseline Vision/History: Wears glasses Wears Glasses: At all times  Perception     Praxis      Pertinent Vitals/Pain Pain Assessment: 0-10 Pain Score: 2  Pain Location: Abdomen Pain Descriptors / Indicators: Dull;Aching Pain Intervention(s): Premedicated before session;Monitored during session     Hand Dominance Right   Extremity/Trunk Assessment Upper Extremity Assessment Upper Extremity Assessment: Overall WFL for tasks assessed   Lower Extremity Assessment Lower Extremity Assessment: Defer to PT evaluation       Communication Communication Communication: No difficulties   Cognition Arousal/Alertness: Awake/alert Behavior During Therapy: WFL for tasks assessed/performed Overall Cognitive Status: Within Functional Limits for tasks assessed                                      General Comments  HR increased to 136 during activity.     Exercises     Shoulder Instructions      Home Living Family/patient expects to be discharged to:: Private residence Living Arrangements: Alone Available Help at Discharge: Family(Son from [place] is coming to stay with him) Type of Home: Apartment Home Access: Stairs to enter [employer] of Steps: 6   Home Layout: One level     Bathroom Shower/Tub: Teacher, early years/pre: Standard     Home Equipment: None          Prior Functioning/Environment Level of Independence: Independent        Comments: Pt independent in ADLs, IADLs, and mobility. Pt does not ambulate with an assistive device. Pt reports 0 falls. Pt still drives and works as a Psychologist, prison and probation services:        OT Treatment/Interventions:      OT Goals(Current goals can be found in the care plan section) Acute Rehab OT Goals Patient Stated Goal: to go home  OT Frequency:     Barriers to D/C:            Co-evaluation              AM-PAC OT "6 Clicks" Daily Activity     Outcome Measure Help from another person eating meals?: None Help from another person taking care of personal grooming?: None Help from another person toileting, which includes using toliet, bedpan, or urinal?: None Help from another person bathing (including washing, rinsing, drying)?: None Help from another person to put on and taking off regular upper body clothing?: None Help from another person to put on and taking off regular lower body clothing?: None 6 Click Score: 24   End of Session Equipment Utilized During Treatment: Other (comment)(none) Nurse Communication: Mobility status  Activity Tolerance: Patient tolerated treatment well Patient left: in chair;with call bell/phone within reach  OT Visit Diagnosis: Pain Pain - Right/Left: (anterior) Pain - part of body: (abdomen)                 Time: 1140-1155 OT Time Calculation (min): 15 min Charges:  OT General Charges $OT Visit: 1 Visit OT Evaluation $OT Eval Low Complexity: 1 Low  Mauri Brooklyn OTR/L [phone]  Mauri Brooklyn 04/06/2019, 12:19 PM

## 2019-04-06 NOTE — Progress Notes (Signed)
Pt HR sustaining 120's at rest and could get up to 150's at activity. VS as follows; BP=154/97 MAP=114, O2=95 on 3LNC, T=99.9. Pt denies CP or SOB. C/o abd pain. PRN dialudid and tylenol given. Made on call provider aware. PRN Metoprolol 5mg  q6hrs ordered and given. Willl continue to closely monitor pt.

## 2019-04-06 NOTE — Progress Notes (Signed)
Subjective: Patient is on clear liquid diet and is very skeptical of advancing his diet as he had not have a bowel movement since admission. He has been able to walk around the hallways and complete OT/PT evaluation. He continues to have abdominal discomfort, which has been progressively improving since admission. Denies nausea or vomiting.  Objective: Vital signs in last 24 hours: Temp:  [98.4 F (36.9 C)-101 F (38.3 C)] 98.6 F (37 C) (03/14 1345) Pulse Rate:  [75-117] 110 (03/14 1345) Resp:  [16-20] 18 (03/14 1345) BP: (136-148)/(90-99) 148/99 (03/14 1345) SpO2:  [92 %-95 %] 92 % (03/14 1345) Weight change:  Last BM Date: 04/03/19  PE: Lying on bed, appears comfortable, on room air GENERAL: No pallor, no icterus ABDOMEN: Soft, mild generalized tenderness, hypoactive bowel sounds EXTREMITIES: No deformity  Lab Results: Results for orders placed or performed during the hospital encounter of 04/03/19 (from the past 48 hour(s))  Comprehensive metabolic panel     Status: Abnormal   Collection Time: 04/05/19  2:27 PM  Result Value Ref Range   Sodium 136 135 - 145 mmol/L    Comment: DELTA CHECK NOTED   Potassium 4.0 3.5 - 5.1 mmol/L   Chloride 104 98 - 111 mmol/L   CO2 22 22 - 32 mmol/L   Glucose, Bld 96 70 - 99 mg/dL    Comment: Glucose reference range applies only to samples taken after fasting for at least 8 hours.   BUN 10 6 - 20 mg/dL   Creatinine, Ser 8.46 (H) 0.61 - 1.24 mg/dL   Calcium 8.3 (L) 8.9 - 10.3 mg/dL   Total Protein 6.9 6.5 - 8.1 g/dL   Albumin 3.3 (L) 3.5 - 5.0 g/dL   AST 86 (H) 15 - 41 U/L   ALT 324 (H) 0 - 44 U/L   Alkaline Phosphatase 248 (H) 38 - 126 U/L   Total Bilirubin 1.6 (H) 0.3 - 1.2 mg/dL   GFR calc non Af Amer 54 (L) >60 mL/min   GFR calc Af Amer >60 >60 mL/min   Anion gap 10 5 - 15    Comment: Performed at [facility], [address] [place], [place] [address]  CBC with Differential/Platelet     Status: None   Collection Time:  04/06/19  4:55 AM  Result Value Ref Range   WBC 9.7 4.0 - 10.5 K/uL   RBC 4.73 4.22 - 5.81 MIL/uL   Hemoglobin 14.7 13.0 - 17.0 g/dL   HCT 28.4 13.2 - 44.0 %   MCV 94.7 80.0 - 100.0 fL   MCH 31.1 26.0 - 34.0 pg   MCHC 32.8 30.0 - 36.0 g/dL   RDW 10.2 72.5 - 36.6 %   Platelets 174 150 - 400 K/uL   nRBC 0.0 0.0 - 0.2 %   Neutrophils Relative % 78 %   Neutro Abs 7.5 1.7 - 7.7 K/uL   Lymphocytes Relative 15 %   Lymphs Abs 1.5 0.7 - 4.0 K/uL   Monocytes Relative 7 %   Monocytes Absolute 0.7 0.1 - 1.0 K/uL   Eosinophils Relative 0 %   Eosinophils Absolute 0.0 0.0 - 0.5 K/uL   Basophils Relative 0 %   Basophils Absolute 0.0 0.0 - 0.1 K/uL   Immature Granulocytes 0 %   Abs Immature Granulocytes 0.03 0.00 - 0.07 K/uL    Comment: Performed at [facility], [address] [place], [place] [address]  Comprehensive metabolic panel     Status: Abnormal  Collection Time: 04/06/19  4:55 AM  Result Value Ref Range   Sodium 136 135 - 145 mmol/L   Potassium 3.8 3.5 - 5.1 mmol/L   Chloride 102 98 - 111 mmol/L   CO2 25 22 - 32 mmol/L   Glucose, Bld 108 (H) 70 - 99 mg/dL    Comment: Glucose reference range applies only to samples taken after fasting for at least 8 hours.   BUN 8 6 - 20 mg/dL   Creatinine, Ser 1.40 (H) 0.61 - 1.24 mg/dL   Calcium 8.3 (L) 8.9 - 10.3 mg/dL   Total Protein 6.6 6.5 - 8.1 g/dL   Albumin 2.9 (L) 3.5 - 5.0 g/dL   AST 51 (H) 15 - 41 U/L   ALT 243 (H) 0 - 44 U/L   Alkaline Phosphatase 200 (H) 38 - 126 U/L   Total Bilirubin 1.8 (H) 0.3 - 1.2 mg/dL   GFR calc non Af Amer 57 (L) >60 mL/min   GFR calc Af Amer >60 >60 mL/min   Anion gap 9 5 - 15    Comment: Performed at [facility] [address] [place], [place] [address]  Lipase, blood     Status: Abnormal   Collection Time: 04/06/19  4:55 AM  Result Value Ref Range   Lipase 76 (H) 11 - 51 U/L    Comment: Performed at [facility], [place] [address], [place],  [address]     Studies/Results: DG Chest 1 View  Result Date: 04/05/2019 CLINICAL DATA:  Fever and shortness of breath. EXAM: CHEST  1 VIEW COMPARISON:  None. FINDINGS: Decreased lung volumes are seen which is likely, in part, secondary to suboptimal patient inspiration. Mild atelectasis is noted within the bilateral lung bases. Mild blunting of the bilateral costophrenic angles is seen. No pneumothorax is identified. The heart size and mediastinal contours are within normal limits. The visualized skeletal structures are unremarkable. IMPRESSION: 1. Low lung volumes with mild bibasilar atelectasis and very small bilateral pleural effusions. Electronically Signed   By: Virgina Norfolk M.D.   On: 04/05/2019 18:13    Medications: I have reviewed the patient's current medications.  Assessment: Pancreatitis with elevated LFTs which are now normalized?  Possibly passed stone, MRCP did not show any gallstones or biliary dilatation Renal impairment, normalized BUN/creatinine/GFR  Incidental diagnosis of cirrhosis, history of significant alcohol use in the past, normal ceruloplasmin, alpha-1 antitrypsin, ferritin, hep B surface antigen and hep C, serology markers for ASMA and AMA pending  Plan: Wants to continue clear liquid diet for now, if he has not had a bowel movement in a.m. recommend getting an abdominal x-ray to rule out ileus, otherwise plan on advancing diet to full liquids in a.m. Continue lactated Ringer's at 50 cc an hour for now.   Ronnette Juniper, MD 04/06/2019, 3:14 PM

## 2019-04-06 NOTE — Progress Notes (Signed)
Physical Therapy Evaluation Patient Details Name: Todd Buchanan MRN: [id number] DOB: [birth year] Today's Date: 04/06/2019   History of Present Illness  Todd Buchanan is a 54 y.o. male with medical history significant of remote Hx of CHF (no longer needing medications) presented with on and off abdominal pain.   Symptoms started about 2 months ago, associated with meals, time also had pain at night.   Denied any feeling of nauseous vomiting, diarrhea, no weight loss.   Given to see a local GI specialist in [facility], symptomatic gallstone was suspected, and he was told to return the hospital if he had any worsening of his symptoms.  Last night patient had a severe episode of abdominal pain, cramping like a 10 out of 10, with 1 episode of NBNB vomiting.  Then he has vomited multiple times over night and this morning.    Also mentioned that his stool color has been lighter since last month, and urine color has turned darker.  He started to feel episode of chills this morning.  Denied any chest pain, no short of breath.   Clinical Impression  PTA pt independent in ADLs, IADLs, and mobility. Pt does not ambulate with an assistive device. Pt reports 0 falls. Pt still drives and works as a Curator. Pt demonstrates no further need for skilled PT services as he is ambulating independently around the unit and has scored 24/24 on DGI indicating minimal fall risk.  He demonstrated no near LOB when performing gait, around and over objects, and with head turns and changes of speed. Pt is at baseline level and educated regarding optimal mobility in hospital to keep strength while balancing pain interventions. Educated pt on safety strategies, fall prevention, strengthening exercises and energy conservation with good understanding and return demonstrartion. Pt functioning near baseline for functional mobility and gait with skilled PT services not warranted at this time.    Follow Up Recommendations  No PT follow up    Equipment Recommendations  None recommended by PT       Precautions / Restrictions Precautions Precautions: None Restrictions Weight Bearing Restrictions: No      Mobility  Bed Mobility Overal bed mobility: Modified Independent      General bed mobility comments: Pt OOB in recliner upon approach  Transfers Overall transfer level: Independent Equipment used: None      General transfer comment: Pt able to ambulate around room, over/around obsticles without any LOB indicated. States has walked halls 550 modI pushing IV pole without supervision.  Ambulation/Gait Ambulation/Gait assistance: Independent Gait Distance (Feet): 150 Feet Assistive device: None Gait Pattern/deviations: Step-through pattern;Wide base of support Gait velocity: WNL Gait velocity interpretation: 1.31 - 2.62 ft/sec, indicative of limited community ambulator      Balance Overall balance assessment: Independent          Standardized Balance Assessment Standardized Balance Assessment : Dynamic Gait Index   Dynamic Gait Index Level Surface: Normal Change in Gait Speed: Normal Gait with Horizontal Head Turns: Normal Gait with Vertical Head Turns: Normal Gait and Pivot Turn: Normal Step Over Obstacle: Normal Step Around Obstacles: Normal Steps: Normal Total Score: 24       Pertinent Vitals/Pain Pain Assessment: 0-10 Pain Score: 3  Pain Location: Abdomen Pain Descriptors / Indicators: Dull;Aching Pain Intervention(s): Repositioned    Home Living Family/patient expects to be discharged to:: Private residence Living Arrangements: Alone Available Help at Discharge: Family(son from [place] coming to stay with him) Type of Home: Apartment Home Access: Stairs to  enter   Entrance Stairs-Number of Steps: 6 Home Layout: One level Home Equipment: None      Prior Function Level of Independence: Independent     Comments: Pt independent in ADLs, IADLs, and mobility. Pt does  not ambulate with an assistive device. Pt reports 0 falls. 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        Assessment/Plan    PT Assessment Patent does not need any further PT services         PT Goals (Current goals can be found in the Care Plan section)  Acute Rehab PT Goals Patient Stated Goal: to go home     AM-PAC PT "6 Clicks" Mobility  Outcome Measure Help needed turning from your back to your side while in a flat bed without using bedrails?: None Help needed moving from lying on your back to sitting on the side of a flat bed without using bedrails?: None Help needed moving to and from a bed to a chair (including a wheelchair)?: None Help needed standing up from a chair using your arms (e.g., wheelchair or bedside chair)?: None Help needed to walk in hospital room?: None Help needed climbing 3-5 steps with a railing? : None 6 Click Score: 24    End of Session Equipment Utilized During Treatment: Gait belt Activity Tolerance: Patient tolerated treatment well Patient left: in chair;with call bell/phone within reach Nurse Communication: Mobility status PT Visit Diagnosis: Difficulty in walking, not elsewhere classified (R26.2)    Time: 1259-1311 PT Time Calculation (min) (ACUTE ONLY): 12 min   Charges:   PT Evaluation $PT Eval Low Complexity: 1 Low           Oda Cogan PT, DPT Acute Rehab [facility] P: [phone]   Jaja Switalski A Wyatt Galvan 04/06/2019, 1:22 PM

## 2019-04-07 ENCOUNTER — Inpatient Hospital Stay (HOSPITAL_COMMUNITY): Payer: BLUE CROSS/BLUE SHIELD

## 2019-04-07 LAB — CBC WITH DIFFERENTIAL/PLATELET
Abs Immature Granulocytes: 0.02 10*3/uL (ref 0.00–0.07)
Basophils Absolute: 0 10*3/uL (ref 0.0–0.1)
Basophils Relative: 0 %
Eosinophils Absolute: 0 10*3/uL (ref 0.0–0.5)
Eosinophils Relative: 0 %
HCT: 45.7 % (ref 39.0–52.0)
Hemoglobin: 15.1 g/dL (ref 13.0–17.0)
Immature Granulocytes: 0 %
Lymphocytes Relative: 21 %
Lymphs Abs: 1.7 10*3/uL (ref 0.7–4.0)
MCH: 31.1 pg (ref 26.0–34.0)
MCHC: 33 g/dL (ref 30.0–36.0)
MCV: 94.2 fL (ref 80.0–100.0)
Monocytes Absolute: 0.7 10*3/uL (ref 0.1–1.0)
Monocytes Relative: 9 %
Neutro Abs: 5.9 10*3/uL (ref 1.7–7.7)
Neutrophils Relative %: 70 %
Platelets: 193 10*3/uL (ref 150–400)
RBC: 4.85 MIL/uL (ref 4.22–5.81)
RDW: 13.7 % (ref 11.5–15.5)
WBC: 8.4 10*3/uL (ref 4.0–10.5)
nRBC: 0 % (ref 0.0–0.2)

## 2019-04-07 LAB — COMPREHENSIVE METABOLIC PANEL
ALT: 190 U/L — ABNORMAL HIGH (ref 0–44)
AST: 36 U/L (ref 15–41)
Albumin: 3 g/dL — ABNORMAL LOW (ref 3.5–5.0)
Alkaline Phosphatase: 214 U/L — ABNORMAL HIGH (ref 38–126)
Anion gap: 8 (ref 5–15)
BUN: 6 mg/dL (ref 6–20)
CO2: 28 mmol/L (ref 22–32)
Calcium: 8.9 mg/dL (ref 8.9–10.3)
Chloride: 102 mmol/L (ref 98–111)
Creatinine, Ser: 1.36 mg/dL — ABNORMAL HIGH (ref 0.61–1.24)
GFR calc Af Amer: >60 mL/min (ref >60)
GFR calc non Af Amer: 59 mL/min — ABNORMAL LOW (ref >60)
Glucose, Bld: 113 mg/dL — ABNORMAL HIGH (ref 70–99)
Potassium: 4.1 mmol/L (ref 3.5–5.1)
Sodium: 138 mmol/L (ref 135–145)
Total Bilirubin: 1.5 mg/dL — ABNORMAL HIGH (ref 0.3–1.2)
Total Protein: 7.3 g/dL (ref 6.5–8.1)

## 2019-04-07 LAB — ANTINUCLEAR ANTIBODIES, IFA: ANA Ab, IFA: NEGATIVE

## 2019-04-07 LAB — MITOCHONDRIAL ANTIBODIES: Mitochondrial M2 Ab, IgG: <20 Units (ref 0.0–20.0)

## 2019-04-07 LAB — ANTI-SMOOTH MUSCLE ANTIBODY, IGG: F-Actin IgG: 12 Units (ref 0–19)

## 2019-04-07 MED ORDER — PANTOPRAZOLE SODIUM 40 MG PO TBEC
40.0000 mg | DELAYED_RELEASE_TABLET | Freq: Every day | ORAL | Status: DC
  Administered 2019-04-08 – 2019-04-10 (×3): 40 mg via ORAL
  Filled 2019-04-07 (×3): qty 1

## 2019-04-07 MED ORDER — BISACODYL 5 MG PO TBEC
5.0000 mg | DELAYED_RELEASE_TABLET | Freq: Once | ORAL | Status: AC
  Administered 2019-04-07: 5 mg via ORAL
  Filled 2019-04-07: qty 1

## 2019-04-07 NOTE — Progress Notes (Signed)
Subjective: Passing flatus.  Less abdominal distention.  Ambulating halls. Feels better but still some pain.  Objective: Vital signs in last 24 hours: Temp:  [98.5 F (36.9 C)-99.9 F (37.7 C)] 98.5 F (36.9 C) (03/15 0423) Pulse Rate:  [94-124] 94 (03/15 0423) Resp:  [16-20] 16 (03/15 0423) BP: (146-154)/(97-100) 146/100 (03/15 0423) SpO2:  [92 %-99 %] 99 % (03/15 0423) Weight change:  Last BM Date: 04/03/19  PE: GEN:  NAD ABD:  Mild distended, mild tympany, mild tenderness, bowel sounds present.  Lab Results: CBC    Component Value Date/Time   WBC 8.4 04/07/2019 0406   RBC 4.85 04/07/2019 0406   HGB 15.1 04/07/2019 0406   HCT 45.7 04/07/2019 0406   PLT 193 04/07/2019 0406   MCV 94.2 04/07/2019 0406   MCH 31.1 04/07/2019 0406   MCHC 33.0 04/07/2019 0406   RDW 13.7 04/07/2019 0406   LYMPHSABS 1.7 04/07/2019 0406   MONOABS 0.7 04/07/2019 0406   EOSABS 0.0 04/07/2019 0406   BASOSABS 0.0 04/07/2019 0406   CMP     Component Value Date/Time   NA 138 04/07/2019 0406   K 4.1 04/07/2019 0406   CL 102 04/07/2019 0406   CO2 28 04/07/2019 0406   GLUCOSE 113 (H) 04/07/2019 0406   BUN 6 04/07/2019 0406   CREATININE 1.36 (H) 04/07/2019 0406   CALCIUM 8.9 04/07/2019 0406   PROT 7.3 04/07/2019 0406   ALBUMIN 3.0 (L) 04/07/2019 0406   AST 36 04/07/2019 0406   ALT 190 (H) 04/07/2019 0406   ALKPHOS 214 (H) 04/07/2019 0406   BILITOT 1.5 (H) 04/07/2019 0406   GFRNONAA 59 (L) 04/07/2019 0406   GFRAA >60 04/07/2019 0406   Assessment:  1.  Pancreatitis, acute, suspect passed bile duct stone (though no stone seen in GB or CBD). 2.  Ileus, likely from #1 above and some contribution from narcotics. 3.  Abdominal  Pain, likely from #1 and #2 above. 4.  Elevated LFTs, passed cbd stone?, no choledocholithiasis or cholelithiasis seen on imaging. 5.  Elevated Cr, downtrending.  Plan:  1.  Judicious analgesics; ambulate halls/room; IV fluids. 2.  Advance to full liquids. 3.   [facility] will follow.  Case discussed with Dr. Ella Jubilee.   Todd Buchanan 04/07/2019, 11:18 AM   Cell [phone] If no answer or after 5 PM call [phone]

## 2019-04-07 NOTE — Progress Notes (Signed)
PROGRESS NOTE    Todd Buchanan  NID:[id number] DOB: [birth year] DOA: 04/03/2019 PCP: Patient, No Pcp Per    Brief Narrative:  Patient admitted to the hospital with working diagnosis of acute pancreatitis complicated by acute kidney injury and acute hepatic injury  54 year old male who presented with abdominal pain. He does have significant past medical history for diastolic heart failure. He reported intermittent abdominal pain for last 2 months, associated with meals.The night prior to hospitalization patient developed severe episode of pain, colicky in nature, 10-10 intensity associated with vomiting. On his initial physical examination his blood pressure was 149/112, heart rate 85, respiratory 23, oxygen saturation 92%,he had dry mucous membranes, his lungs were clear to auscultation bilaterally, heart S1-S2 present rhythmic, soft abdomen, tender to palpation right upper quadrant, no lower extremity edema. Sodium 140, potassium 4.0, chloride 103, bicarb 26, glucose 174, BUN 13, creatinine 1.81, albumin 4.1, lipase 6430, AST 1068, ALT 920, total bilirubin is 3.8, GGT 1145,triglycerides 73,white count 9.7, hemoglobin 17.4, hematocrit 52.8, platelets 244. INR 1.1.SARS COVID-19 was negative,urinalysis negative for infection,alcohol less than 10.Abdominal ultrasound with no evidence of gallstones or acute cholecystitis, common bile duct upper limits of normal, 7 mm diameter. Mild left renal hydronephrosis.  MRCP with no evidence of cholelithiasis or biliary duct dilatation. Positive peripancreatic inflammation,which extended along the lateral aspect of Gerota's fascia as well as around the second portion of the duodenum. EKG 86 bpm, left axis deviation, first-degree AV block, right bundle branch block, sinus rhythm, no significant ST segment or T wave changes.  Patient has been placed on supportive medical therapy with good toleration.    Assessment & Plan:   Active  Problems:   Pancreatitis due to biliary obstruction   Acute pancreatitis   Chronic diastolic CHF (congestive heart failure) (HCC)   Essential hypertension   AKI (acute kidney injury) (HCC)   1. Acute pancreatitis/ hepatitis. patient has been ambulating and slowly improving abdominal pain, abdominal films this am with no ileus. No leukocytosis and LFT trending down. Patient continue to have dyspnea.   Diet advanced to full liquids, will dc IV fluids, will continue with IV analgesics, antiemetics and antiacids. Acetaminophen for fever and mild to moderate pain.  Will add one dose of bisacodyl for constipation.     2. Diastolic heart failure. Patient with mild dyspnea and mild rales at bases, no frank decompensation heart failure but high risk for volume overload. Will hold on IV fluids since patient is eating better.   3. HTN. Blood pressure is 145/95, patient not on antihypertensive medications at home. Will continue close monitoring, target inpatient blood pressure less than 160 mmHG.  4. GERD. Continue with pantoprazole. Now on full liquid diet.   5. AKI. Continue with cr down to 1,36, K at 4,1 and serum bicarbonate at 28 with Cl at 102. Will hold on IV fluids and will follow on renal panel in am, avoid hypotension and nephrotoxic medications.    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 Will need outpatient follow up.    DVT prophylaxis:enoxaparin Code Status:full Family Communication:no family at the beside Disposition Plan/ discharge barriers:patient from home, barrier for dc severe pancreatitis, needing inpatient care.    Consultants:   GI      Subjective: Patient has been ambulating, continue to have abdomina distention and pain, no bowel movement yet, no nausea or vomiting, no dyspnea or chest pain.   Objective: Vitals:   04/06/19 0627 04/06/19 1345 04/06/19 2010 04/07/19 0423  BP: Marland Kitchen)  142/99 (!) 148/99 (!) 154/97 (!) 146/100  Pulse: (!) 108 (!) 110  (!) 124 94  Resp: 18 18 20 16   Temp: 98.4 F (36.9 C) 98.6 F (37 C) 99.9 F (37.7 C) 98.5 F (36.9 C)  TempSrc: Oral Oral Oral Oral  SpO2: 93% 92% 95% 99%  Weight:      Height:        Intake/Output Summary (Last 24 hours) at 04/07/2019 1106 Last data filed at 04/07/2019 1607 Gross per 24 hour  Intake 2785.25 ml  Output --  Net 2785.25 ml   Filed Weights   04/03/19 1018  Weight: 101.2 kg    Examination:   General: Not in pain or dyspnea, deconditioned  Neurology: Awake and alert, non focal  E ENT: mild pallor, no icterus, oral mucosa moist Cardiovascular: No JVD. S1-S2 present, rhythmic, no gallops, rubs, or murmurs. No lower extremity edema. Pulmonary: positive breath sounds bilaterally, adequate air movement, no wheezing, or rhonchi, positive rales bilaterally at bases. Gastrointestinal. Abdomen distended with no organomegaly,no rebound or guarding, tender to palpation.  Skin. No rashes Musculoskeletal: no joint deformities     Data Reviewed: I have personally reviewed following labs and imaging studies  CBC: Recent Labs  Lab 04/03/19 1041 04/04/19 0248 04/06/19 0455 04/07/19 0406  WBC 9.7 7.1 9.7 8.4  NEUTROABS  --   --  7.5 5.9  HGB 17.4* 15.8 14.7 15.1  HCT 52.8* 48.3 44.8 45.7  MCV 94.3 94.7 94.7 94.2  PLT 244 217 174 371   Basic Metabolic Panel: Recent Labs  Lab 04/03/19 1041 04/04/19 0248 04/05/19 1427 04/06/19 0455 04/07/19 0406  NA 140 144 136 136 138  K 4.0 4.3 4.0 3.8 4.1  CL 103 106 104 102 102  CO2 26 26 22 25 28   GLUCOSE 174* 106* 96 108* 113*  BUN 13 10 10 8 6   CREATININE 1.81* 1.60* 1.46* 1.40* 1.36*  CALCIUM 10.1 8.9 8.3* 8.3* 8.9   GFR: Estimated Creatinine Clearance: 74.9 mL/min (A) (by C-G formula based on SCr of 1.36 mg/dL (H)). Liver Function Tests: Recent Labs  Lab 04/03/19 1041 04/04/19 0248 04/05/19 1427 04/06/19 0455 04/07/19 0406  AST 1,168* 512* 86* 51* 36  ALT 920* 754* 324* 243* 190*  ALKPHOS 384* 344*  248* 200* 214*  BILITOT 3.8* 2.1* 1.6* 1.8* 1.5*  PROT 8.3* 7.1 6.9 6.6 7.3  ALBUMIN 4.1 3.4* 3.3* 2.9* 3.0*   Recent Labs  Lab 04/03/19 1041 04/06/19 0455  LIPASE 6,430* 76*   No results for input(s): AMMONIA in the last 168 hours. Coagulation Profile: Recent Labs  Lab 04/03/19 2041 04/04/19 0248  INR 1.0 1.1   Cardiac Enzymes: No results for input(s): CKTOTAL, CKMB, CKMBINDEX, TROPONINI in the last 168 hours. BNP (last 3 results) No results for input(s): PROBNP in the last 8760 hours. HbA1C: No results for input(s): HGBA1C in the last 72 hours. CBG: No results for input(s): GLUCAP in the last 168 hours. Lipid Profile: Recent Labs    04/04/19 1100  CHOL 246*  HDL 35*  LDLCALC 196*  TRIG 73  CHOLHDL 7.0   Thyroid Function Tests: No results for input(s): TSH, T4TOTAL, FREET4, T3FREE, THYROIDAB in the last 72 hours. Anemia Panel: No results for input(s): VITAMINB12, FOLATE, FERRITIN, TIBC, IRON, RETICCTPCT in the last 72 hours.    Radiology Studies: I have reviewed all of the imaging during this hospital visit personally     Scheduled Meds: . pantoprazole (PROTONIX) IV  40 mg Intravenous Q12H  .  polyethylene glycol  17 g Oral Daily   Continuous Infusions: . lactated ringers 50 mL/hr at 04/07/19 0443     LOS: 4 days        Todd Buchanan Annett Gula, MD

## 2019-04-07 NOTE — Plan of Care (Signed)

## 2019-04-07 NOTE — Progress Notes (Signed)
Pt ref cpap for the night ans stated that he was advised to use NCAN tonight. Advised to cal lf anything changed.

## 2019-04-07 NOTE — Progress Notes (Signed)
Pt refused CPAP for the night. Stating he would like to keep wear NCAN, pt informed to call for RT if he changes his mind during the night.

## 2019-04-08 ENCOUNTER — Encounter (HOSPITAL_COMMUNITY): Payer: Self-pay | Admitting: Internal Medicine

## 2019-04-08 LAB — CBC WITH DIFFERENTIAL/PLATELET
Abs Immature Granulocytes: 0.02 10*3/uL (ref 0.00–0.07)
Basophils Absolute: 0 10*3/uL (ref 0.0–0.1)
Basophils Relative: 0 %
Eosinophils Absolute: 0 10*3/uL (ref 0.0–0.5)
Eosinophils Relative: 0 %
HCT: 43.5 % (ref 39.0–52.0)
Hemoglobin: 14.7 g/dL (ref 13.0–17.0)
Immature Granulocytes: 0 %
Lymphocytes Relative: 21 %
Lymphs Abs: 1.6 10*3/uL (ref 0.7–4.0)
MCH: 32.1 pg (ref 26.0–34.0)
MCHC: 33.8 g/dL (ref 30.0–36.0)
MCV: 95 fL (ref 80.0–100.0)
Monocytes Absolute: 0.8 10*3/uL (ref 0.1–1.0)
Monocytes Relative: 10 %
Neutro Abs: 5.4 10*3/uL (ref 1.7–7.7)
Neutrophils Relative %: 69 %
Platelets: 226 10*3/uL (ref 150–400)
RBC: 4.58 MIL/uL (ref 4.22–5.81)
RDW: 13.9 % (ref 11.5–15.5)
WBC: 7.9 10*3/uL (ref 4.0–10.5)
nRBC: 0 % (ref 0.0–0.2)

## 2019-04-08 LAB — BASIC METABOLIC PANEL
Anion gap: 11 (ref 5–15)
BUN: 6 mg/dL (ref 6–20)
CO2: 26 mmol/L (ref 22–32)
Calcium: 8.9 mg/dL (ref 8.9–10.3)
Chloride: 99 mmol/L (ref 98–111)
Creatinine, Ser: 1.31 mg/dL — ABNORMAL HIGH (ref 0.61–1.24)
GFR calc Af Amer: >60 mL/min (ref >60)
GFR calc non Af Amer: >60 mL/min (ref >60)
Glucose, Bld: 114 mg/dL — ABNORMAL HIGH (ref 70–99)
Potassium: 4 mmol/L (ref 3.5–5.1)
Sodium: 136 mmol/L (ref 135–145)

## 2019-04-08 NOTE — Progress Notes (Signed)
Subjective: Much improved abdominal pain. Tolerating advancement of diet.  Objective: Vital signs in last 24 hours: Temp:  [98.6 F (37 C)-99.3 F (37.4 C)] 98.6 F (37 C) (03/16 0640) Pulse Rate:  [101-108] 104 (03/16 0640) Resp:  [17-18] 18 (03/16 0640) BP: (138-154)/(96-101) 140/98 (03/16 0640) SpO2:  [97 %-99 %] 98 % (03/16 0640) Weight change:  Last BM Date: 04/03/19  PE: GEN:  NAD ABD:  No distention, mild tenderness, bowel sounds present.  Lab Results: CBC    Component Value Date/Time   WBC 7.9 04/08/2019 0225   RBC 4.58 04/08/2019 0225   HGB 14.7 04/08/2019 0225   HCT 43.5 04/08/2019 0225   PLT 226 04/08/2019 0225   MCV 95.0 04/08/2019 0225   MCH 32.1 04/08/2019 0225   MCHC 33.8 04/08/2019 0225   RDW 13.9 04/08/2019 0225   LYMPHSABS 1.6 04/08/2019 0225   MONOABS 0.8 04/08/2019 0225   EOSABS 0.0 04/08/2019 0225   BASOSABS 0.0 04/08/2019 0225   CMP     Component Value Date/Time   NA 136 04/08/2019 0225   K 4.0 04/08/2019 0225   CL 99 04/08/2019 0225   CO2 26 04/08/2019 0225   GLUCOSE 114 (H) 04/08/2019 0225   BUN 6 04/08/2019 0225   CREATININE 1.31 (H) 04/08/2019 0225   CALCIUM 8.9 04/08/2019 0225   PROT 7.3 04/07/2019 0406   ALBUMIN 3.0 (L) 04/07/2019 0406   AST 36 04/07/2019 0406   ALT 190 (H) 04/07/2019 0406   ALKPHOS 214 (H) 04/07/2019 0406   BILITOT 1.5 (H) 04/07/2019 0406   GFRNONAA >60 04/08/2019 0225   GFRAA >60 04/08/2019 0225   Assessment:  1.  Pancreatitis, acute, suspect passed bile duct stone (though no stone seen in GB or CBD). 2.  Ileus, likely from #1 above and some contribution from narcotics. 3.  Abdominal  Pain, likely from #1 and #2 above. 4.  Elevated LFTs, passed cbd stone?, no choledocholithiasis or cholelithiasis seen on imaging. 5.  Elevated Cr, downtrending.  Plan:  1.  Supportive care (judicious analgesics, ambulate halls/room, IVF). 2.  Full liquids rest of today; advance tomorrow to soft diet. 3.  If tolerates  soft diet tomorrow, hopefully discharge home in 1-2 days. 4.  [facility] will follow.   Todd Buchanan 04/08/2019, 2:38 PM   Cell [phone] If no answer or after 5 PM call [phone]

## 2019-04-08 NOTE — Progress Notes (Signed)
PROGRESS NOTE    Todd Buchanan  EHM:[id number] DOB: Jan 04, 1966 DOA: 04/03/2019 PCP: Patient, No Pcp Per    Brief Narrative:  Patient admitted to the hospital with working diagnosis of acute pancreatitis complicated by acute kidney injury andacutehepatic injury  54 year old male who presented with abdominal pain. He does have significant past medical history for diastolic heart failure. He reported intermittent abdominal pain for last 2 months, associated with meals.The night prior to hospitalization patient developed severe episode of pain, colicky in nature, 10-10 intensity associated with vomiting. On his initial physical examination his blood pressure was 149/112, heart rate 85, respiratory 23, oxygen saturation 92%,he had dry mucous membranes, his lungs were clear to auscultation bilaterally, heart S1-S2 present rhythmic, soft abdomen, tender to palpation right upper quadrant, no lower extremity edema. Sodium 140, potassium 4.0, chloride 103, bicarb 26, glucose 174, BUN 13, creatinine 1.81, albumin 4.1, lipase 6430, AST 1068, ALT 920, total bilirubin is 3.8, GGT 1145,triglycerides 73,white count 9.7, hemoglobin 17.4, hematocrit 52.8, platelets 244. INR 1.1.SARS COVID-19 was negative,urinalysis negative for infection,alcohol less than 10.Abdominal ultrasound with no evidence of gallstones or acute cholecystitis, common bile duct upper limits of normal, 7 mm diameter. Mild left renal hydronephrosis.  MRCP with no evidence of cholelithiasis or biliary duct dilatation. Positive peripancreatic inflammation,which extended along the lateral aspect of Gerota's fascia as well as around the second portion of the duodenum. EKG 86 bpm, left axis deviation, first-degree AV block, right bundle branch block, sinus rhythm, no significant ST segment or T wave changes.  Patient has been placed on supportive medical therapy with good toleration. Follow up abdominal films with no  ileus.    Assessment & Plan:   Active Problems:   Pancreatitis due to biliary obstruction   Acute pancreatitis   Chronic diastolic CHF (congestive heart failure) (HCC)   Essential hypertension   AKI (acute kidney injury) (HCC)   1. Acute pancreatitis/ hepatitis.Abdominal pain continue to slowly improve but is not yet back to baseline, he is tolerating full liquids well. Patient has bowel movement yesterday.   Will continue pain control with analgesics, continue antiacids and as needed antiemetics. Out of bed to chair tid with meals and ambulation.   Continue slowly advance diet.   2. Diastolic heart failure.  Blood pressure is controlled, will continue to hold on IV fluids, no signs of acute decompensation.   3. HTN. Blood pressure 140/98, will continue close monitoring, patient not on antihypertensive agents at home.   4. GERD. On pantoprazole. Tolerating well full liquid diet.   5. AKI.Stable renal function with serum cr at 1,31, K at 4,0and serum bicarbonate at 26with Cl at 99. Patient now off IV fluids and tolerating po well, full liquids.   6. Obesity class 2. BMI is 32.   DVT prophylaxis:enoxaparin Code Status:full Family Communication:no family at the beside Disposition Plan/ discharge barriers:patient from home, barrier for dc severe pancreatitis, needing inpatient care.    Consultants:   GI     Subjective: Patient is feeling better, his abdominal pain is better down to 3/10. Worse with movement, no nausea or vomiting, no further dyspnea, no chest pain. Positive loose bowel movement yesterday.   Objective: Vitals:   04/07/19 2247 04/07/19 2248 04/08/19 0521 04/08/19 0640  BP: (!) 154/101 (!) 151/101 (!) 138/96 (!) 140/98  Pulse: (!) 108 (!) 101 (!) 101 (!) 104  Resp:   17 18  Temp: 99.3 F (37.4 C)  98.6 F (37 C) 98.6 F (37 C)  TempSrc: Oral  Oral Oral  SpO2: 99% 97% 97% 98%  Weight:      Height:        Intake/Output  Summary (Last 24 hours) at 04/08/2019 0948 Last data filed at 04/08/2019 0522 Gross per 24 hour  Intake 1839.5 ml  Output 2050 ml  Net -210.5 ml   Filed Weights   04/03/19 1018  Weight: 101.2 kg    Examination:   General: Not in pain or dyspnea, deconditioned  Neurology: Awake and alert, non focal  E ENT: no pallor, no icterus, oral mucosa moist Cardiovascular: No JVD. S1-S2 present, rhythmic, no gallops, rubs, or murmurs. No lower extremity edema. Pulmonary: positive breath sounds bilaterally, adequate air movement, no wheezing, rhonchi or rales. Gastrointestinal. Abdomen mild distended and mild tender to deep palpation with no organomegaly, no rebound or guarding Skin. No rashes Musculoskeletal: no joint deformities     Data Reviewed: I have personally reviewed following labs and imaging studies  CBC: Recent Labs  Lab 04/03/19 1041 04/04/19 0248 04/06/19 0455 04/07/19 0406 04/08/19 0225  WBC 9.7 7.1 9.7 8.4 7.9  NEUTROABS  --   --  7.5 5.9 5.4  HGB 17.4* 15.8 14.7 15.1 14.7  HCT 52.8* 48.3 44.8 45.7 43.5  MCV 94.3 94.7 94.7 94.2 95.0  PLT 244 217 174 193 616   Basic Metabolic Panel: Recent Labs  Lab 04/04/19 0248 04/05/19 1427 04/06/19 0455 04/07/19 0406 04/08/19 0225  NA 144 136 136 138 136  K 4.3 4.0 3.8 4.1 4.0  CL 106 104 102 102 99  CO2 26 22 25 28 26   GLUCOSE 106* 96 108* 113* 114*  BUN 10 10 8 6 6   CREATININE 1.60* 1.46* 1.40* 1.36* 1.31*  CALCIUM 8.9 8.3* 8.3* 8.9 8.9   GFR: Estimated Creatinine Clearance: 77.8 mL/min (A) (by C-G formula based on SCr of 1.31 mg/dL (H)). Liver Function Tests: Recent Labs  Lab 04/03/19 1041 04/04/19 0248 04/05/19 1427 04/06/19 0455 04/07/19 0406  AST 1,168* 512* 86* 51* 36  ALT 920* 754* 324* 243* 190*  ALKPHOS 384* 344* 248* 200* 214*  BILITOT 3.8* 2.1* 1.6* 1.8* 1.5*  PROT 8.3* 7.1 6.9 6.6 7.3  ALBUMIN 4.1 3.4* 3.3* 2.9* 3.0*   Recent Labs  Lab 04/03/19 1041 04/06/19 0455  LIPASE 6,430* 76*    No results for input(s): AMMONIA in the last 168 hours. Coagulation Profile: Recent Labs  Lab 04/03/19 2041 04/04/19 0248  INR 1.0 1.1   Cardiac Enzymes: No results for input(s): CKTOTAL, CKMB, CKMBINDEX, TROPONINI in the last 168 hours. BNP (last 3 results) No results for input(s): PROBNP in the last 8760 hours. HbA1C: No results for input(s): HGBA1C in the last 72 hours. CBG: No results for input(s): GLUCAP in the last 168 hours. Lipid Profile: No results for input(s): CHOL, HDL, LDLCALC, TRIG, CHOLHDL, LDLDIRECT in the last 72 hours. Thyroid Function Tests: No results for input(s): TSH, T4TOTAL, FREET4, T3FREE, THYROIDAB in the last 72 hours. Anemia Panel: No results for input(s): VITAMINB12, FOLATE, FERRITIN, TIBC, IRON, RETICCTPCT in the last 72 hours.    Radiology Studies: I have reviewed all of the imaging during this hospital visit personally     Scheduled Meds: . pantoprazole  40 mg Oral Daily  . polyethylene glycol  17 g Oral Daily   Continuous Infusions:   LOS: 5 days        Dejean Tribby Gerome Apley, MD

## 2019-04-09 DIAGNOSIS — K219 Gastro-esophageal reflux disease without esophagitis: Secondary | ICD-10-CM

## 2019-04-09 DIAGNOSIS — E66812 Obesity, class 2: Secondary | ICD-10-CM

## 2019-04-09 DIAGNOSIS — E669 Obesity, unspecified: Secondary | ICD-10-CM

## 2019-04-09 LAB — BASIC METABOLIC PANEL
Anion gap: 12 (ref 5–15)
BUN: 8 mg/dL (ref 6–20)
CO2: 23 mmol/L (ref 22–32)
Calcium: 9.1 mg/dL (ref 8.9–10.3)
Chloride: 100 mmol/L (ref 98–111)
Creatinine, Ser: 1.54 mg/dL — ABNORMAL HIGH (ref 0.61–1.24)
GFR calc Af Amer: 59 mL/min — ABNORMAL LOW (ref >60)
GFR calc non Af Amer: 51 mL/min — ABNORMAL LOW (ref >60)
Glucose, Bld: 124 mg/dL — ABNORMAL HIGH (ref 70–99)
Potassium: 3.6 mmol/L (ref 3.5–5.1)
Sodium: 135 mmol/L (ref 135–145)

## 2019-04-09 MED ORDER — TRAMADOL HCL 50 MG PO TABS
50.0000 mg | ORAL_TABLET | Freq: Four times a day (QID) | ORAL | Status: DC | PRN
  Administered 2019-04-09 – 2019-04-10 (×3): 50 mg via ORAL
  Filled 2019-04-09 (×3): qty 1

## 2019-04-09 NOTE — Progress Notes (Signed)
Patient improving; less abdominal pain; tolerating advancement of diet.  Plan for hopeful discharge tomorrow if he continues to do well.

## 2019-04-09 NOTE — Progress Notes (Signed)
PROGRESS NOTE    Todd Buchanan  ACZ:[id number] DOB: October 07, 1965 DOA: 04/03/2019 PCP: Patient, No Pcp Per    Brief Narrative:  Patient admitted to the hospital with working diagnosis of acute pancreatitis complicated by acute kidney injury andacutehepatic injury  54 year old male who presented with abdominal pain. He does have significant past medical history for diastolic heart failure. He reported intermittent abdominal pain for last 2 months, associated with meals.The night prior to hospitalization patient developed severe episode of pain, colicky in nature, 10-93 intensity associated with vomiting. On his initial physical examination his blood pressure was 149/112, heart rate 85, respiratory 23, oxygen saturation 92%,he had dry mucous membranes, his lungs were clear to auscultation bilaterally, heart S1-S2 present rhythmic, soft abdomen, tender to palpation right upper quadrant, no lower extremity edema. Sodium 140, potassium 4.0, chloride 103, bicarb 26, glucose 174, BUN 13, creatinine 1.81, albumin 4.1, lipase 6430, AST 1068, ALT 920, total bilirubin is 3.8, GGT 1145,triglycerides 73,white count 9.7, hemoglobin 17.4, hematocrit 52.8, platelets 244. INR 1.1.SARS COVID-19 was negative,urinalysis negative for infection,alcohol less than 10.Abdominal ultrasound with no evidence of gallstones or acute cholecystitis, common bile duct upper limits of normal, 7 mm diameter. Mild left renal hydronephrosis.  MRCP with no evidence of cholelithiasis or biliary duct dilatation. Positive peripancreatic inflammation,which extended along the lateral aspect of Gerota's fascia as well as around the second portion of the duodenum. EKG 86 bpm, left axis deviation, first-degree AV block, right bundle branch block, sinus rhythm, no significant ST segment or T wave changes.  Patient has been placed on supportive medical therapy with good toleration. Follow up abdominal films with no  ileus.   Slowly advancing his diet with good toleration.    Assessment & Plan:   Principal Problem:   Acute pancreatitis Active Problems:   Chronic diastolic CHF (congestive heart failure) (HCC)   Essential hypertension   AKI (acute kidney injury) ([place])   Class 2 obesity   GERD (gastroesophageal reflux disease)   1. Acute pancreatitis/ hepatitis.Continue to improve abdominal pain, no nausea or vomiting.   Transition to oral analgesics and continue antiacids and as needed antiemetics.   Continue to advance diet to soft, with plan to dc home in am if good toleration.   2. Diastolic heart failure. clinically with no signs of decompensation.   3. HTN. Blood pressure 124/84, at home with no medications.  4. GERD.Continue with pantoprazole.   5. AKI on CKD stage 2.Renal function with stable serum cr at 1,4 to 1,5, patient will need outpatient follow up.  6. Obesity class 2. BMI is 32.Follow up as out patient.    DVT prophylaxis:enoxaparin Code Status:full Family Communication:no family at the beside Disposition Plan/ discharge barriers:patient from home, barrier for dc severe pancreatitis, needing inpatient care.    Subjective: Patient's pain continue to improve, tolerating well clear liquid diet, with no nausea or vomiting, no chest pain or dyspnea.   Objective: Vitals:   04/08/19 2149 04/08/19 2200 04/09/19 0000 04/09/19 0536  BP: (!) 134/97 (!) 135/96  (!) 144/95  Pulse: (!) 121 (!) 101 98 100  Resp: 18 18 18 18   Temp: 99 F (37.2 C) 99 F (37.2 C)  98.4 F (36.9 C)  TempSrc: Oral Oral  Oral  SpO2: 94% 95% 97% 95%  Weight:      Height:       No intake or output data in the 24 hours ending 04/09/19 0851 Filed Weights   04/03/19 1018  Weight: 101.2 kg  Examination:   General: Not in pain or dyspnea  Neurology: Awake and alert, non focal  E ENT: no pallor, no icterus, oral mucosa moist Cardiovascular: No JVD. S1-S2 present,  rhythmic, no gallops, rubs, or murmurs. No lower extremity edema. Pulmonary: oositive breath sounds bilaterally, adequate air movement, no wheezing, rhonchi or rales. Gastrointestinal. Abdomen mild distended with no organomegaly, non tender, no rebound or guarding Skin. No rashes Musculoskeletal: no joint deformities     Data Reviewed: I have personally reviewed following labs and imaging studies  CBC: Recent Labs  Lab 04/03/19 1041 04/04/19 0248 04/06/19 0455 04/07/19 0406 04/08/19 0225  WBC 9.7 7.1 9.7 8.4 7.9  NEUTROABS  --   --  7.5 5.9 5.4  HGB 17.4* 15.8 14.7 15.1 14.7  HCT 52.8* 48.3 44.8 45.7 43.5  MCV 94.3 94.7 94.7 94.2 95.0  PLT 244 217 174 193 226   Basic Metabolic Panel: Recent Labs  Lab 04/04/19 0248 04/05/19 1427 04/06/19 0455 04/07/19 0406 04/08/19 0225  NA 144 136 136 138 136  K 4.3 4.0 3.8 4.1 4.0  CL 106 104 102 102 99  CO2 26 22 25 28 26   GLUCOSE 106* 96 108* 113* 114*  BUN 10 10 8 6 6   CREATININE 1.60* 1.46* 1.40* 1.36* 1.31*  CALCIUM 8.9 8.3* 8.3* 8.9 8.9   GFR: Estimated Creatinine Clearance: 77.8 mL/min (A) (by C-G formula based on SCr of 1.31 mg/dL (H)). Liver Function Tests: Recent Labs  Lab 04/03/19 1041 04/04/19 0248 04/05/19 1427 04/06/19 0455 04/07/19 0406  AST 1,168* 512* 86* 51* 36  ALT 920* 754* 324* 243* 190*  ALKPHOS 384* 344* 248* 200* 214*  BILITOT 3.8* 2.1* 1.6* 1.8* 1.5*  PROT 8.3* 7.1 6.9 6.6 7.3  ALBUMIN 4.1 3.4* 3.3* 2.9* 3.0*   Recent Labs  Lab 04/03/19 1041 04/06/19 0455  LIPASE 6,430* 76*   No results for input(s): AMMONIA in the last 168 hours. Coagulation Profile: Recent Labs  Lab 04/03/19 2041 04/04/19 0248  INR 1.0 1.1   Cardiac Enzymes: No results for input(s): CKTOTAL, CKMB, CKMBINDEX, TROPONINI in the last 168 hours. BNP (last 3 results) No results for input(s): PROBNP in the last 8760 hours. HbA1C: No results for input(s): HGBA1C in the last 72 hours. CBG: No results for input(s):  GLUCAP in the last 168 hours. Lipid Profile: No results for input(s): CHOL, HDL, LDLCALC, TRIG, CHOLHDL, LDLDIRECT in the last 72 hours. Thyroid Function Tests: No results for input(s): TSH, T4TOTAL, FREET4, T3FREE, THYROIDAB in the last 72 hours. Anemia Panel: No results for input(s): VITAMINB12, FOLATE, FERRITIN, TIBC, IRON, RETICCTPCT in the last 72 hours.    Radiology Studies: I have reviewed all of the imaging during this hospital visit personally     Scheduled Meds: . pantoprazole  40 mg Oral Daily  . polyethylene glycol  17 g Oral Daily   Continuous Infusions:   LOS: 6 days        Todd Buchanan 2042, MD

## 2019-04-09 NOTE — Plan of Care (Signed)

## 2019-04-09 NOTE — Progress Notes (Signed)
RT went to place patient on CPAP.  Patient was not ready and instructed to have RN call RT when ready to be placed on CPAP.  Patient showed understanding.

## 2019-04-10 LAB — BASIC METABOLIC PANEL
Anion gap: 9 (ref 5–15)
BUN: 12 mg/dL (ref 6–20)
CO2: 24 mmol/L (ref 22–32)
Calcium: 8.9 mg/dL (ref 8.9–10.3)
Chloride: 101 mmol/L (ref 98–111)
Creatinine, Ser: 1.51 mg/dL — ABNORMAL HIGH (ref 0.61–1.24)
GFR calc Af Amer: >60 mL/min (ref >60)
GFR calc non Af Amer: 52 mL/min — ABNORMAL LOW (ref >60)
Glucose, Bld: 132 mg/dL — ABNORMAL HIGH (ref 70–99)
Potassium: 3.8 mmol/L (ref 3.5–5.1)
Sodium: 134 mmol/L — ABNORMAL LOW (ref 135–145)

## 2019-04-10 MED ORDER — ACETAMINOPHEN 500 MG PO TABS: 500.0000 mg | ORAL_TABLET | Freq: Four times a day (QID) | ORAL | 0 refills | Status: DC | PRN

## 2019-04-10 MED ORDER — POLYETHYLENE GLYCOL 3350 17 G PO PACK: 17.0000 g | PACK | Freq: Every day | ORAL | 0 refills | Status: DC | PRN

## 2019-04-10 MED ORDER — PANTOPRAZOLE SODIUM 40 MG PO TBEC: 40.0000 mg | DELAYED_RELEASE_TABLET | Freq: Every day | ORAL | 0 refills | Status: DC

## 2019-04-10 MED ORDER — TRAMADOL HCL 50 MG PO TABS: 50.0000 mg | ORAL_TABLET | Freq: Four times a day (QID) | ORAL | 0 refills | Status: DC | PRN

## 2019-04-10 NOTE — Discharge Summary (Signed)
Physician Discharge Summary  Charlette CaffeyLamont Scott Brensinger YNW:[id number] DOB: 08/25/1965 DOA: 04/03/2019  PCP: Patient, No Pcp Per  Admit date: 04/03/2019 Discharge date: 04/10/2019  Admitted From: Home  Disposition:  Home   Recommendations for Outpatient Follow-up and new medication changes:  1. Follow up with Primary Care in 7 days.  2. Pain control with acetaminophen and tramadol.  3. Follow-up kidney function as an outpatient.  Home Health: no   Equipment/Devices: no   Discharge Condition: stable  CODE STATUS: full  Diet recommendation: heart healthy   Brief/Interim Summary: Patient admitted to the hospital with working diagnosis of acute pancreatitis complicated by acute kidney injury andacutehepatic injury  54 year old male who presented with abdominal pain. He does have significant past medical history for diastolic heart failure. He reported intermittent abdominal pain for last 2 months, associated with meals.The night prior to hospitalization patient developed a severe episode of abdominal pain, colicky in nature, 10-10 intensity associated with vomiting. On his initial physical examination his blood pressure was 149/112, heart rate 85, respiratory rate 23, oxygen saturation 92%,he had dry mucous membranes, his lungs were clear to auscultation bilaterally, heart S1-S2 present rhythmic, soft abdomen, tender to palpation right upper quadrant, no lower extremity edema. Sodium 140, potassium 4.0, chloride 103, bicarb 26, glucose 174, BUN 13, creatinine 1.81, albumin 4.1, lipase 6,430, AST 1,068, ALT 920, total bilirubin is 3.8, GGT 1145,triglycerides 73,white count 9.7, hemoglobin 17.4, hematocrit 52.8, platelets 244. INR 1.1.SARS COVID-19 was negative,urinalysis negative for infection,alcohol less than 10.Abdominal ultrasound with no evidence of gallstones or acute cholecystitis, common bile duct upper limits of normal, 7 mm diameter. Mild left renal hydronephrosis.  MRCP  with no evidence of cholelithiasis or biliary duct dilatation. Positive peripancreatic inflammation,which extended along the lateral aspect of Gerota's fascia as well as around the second portion of the duodenum. EKG 86 bpm, left axis deviation, first-degree AV block, right bundle branch block, sinus rhythm, no significant ST segment or T wave changes.  Patient waas placed on supportive medical therapy with good toleration.Follow up abdominal films with no ileus.  Slowly advanced his diet with good toleration.   1. Acute pancreatitis/ hepatitis.  Patient was admitted to the medical ward, he received supportive medical therapy including intravenous fluids, antiacids, as needed analgesics and antiemetics. Patient was ruled out for biliary disease through an MRCP.  His acute hepatitis panel, A, B and C were negative.  Patient diet was advanced slowly with good toleration.  At discharge his AST was 36, ALT 190, total bilirubin is 1.5.  2.  Acute kidney injury on chronic kidney disease stage II.  Patient's kidney function improved with intravenous fluids, his creatinine at discharge is 1.5, potassium 3.8, bicarb 24.  Recommend outpatient follow-up of kidney function.  2.  Diastolic heart failure.  Patient remained stable with no signs of exacerbation.  3.  Hypertension.  Patient had episodic hypotension related to abdominal pain triggered by acute pancreatitis.  As his symptoms improved his blood pressure normalized.  His discharge blood pressure is 128/96.  4.  GERD.  Continue antiacid therapy.  6.  Obesity class II.  His BMI is 32, patient will follow-up as an outpatient.  Discharge Diagnoses:  Principal Problem:   Acute pancreatitis Active Problems:   Chronic diastolic CHF (congestive heart failure) (HCC)   Essential hypertension   AKI (acute kidney injury) (HCC)   Class 2 obesity   GERD (gastroesophageal reflux disease)    Discharge Instructions Allergies as of 04/10/2019   No  Known  Allergies     Medication List    STOP taking these medications   PEPCID PO     TAKE these medications   acetaminophen 500 MG tablet Commonly known as: TYLENOL Take 1 tablet (500 mg total) by mouth every 6 (six) hours as needed for moderate pain or fever.   pantoprazole 40 MG tablet Commonly known as: PROTONIX Take 1 tablet (40 mg total) by mouth daily.   polyethylene glycol 17 g packet Commonly known as: MIRALAX / GLYCOLAX Take 17 g by mouth daily as needed (constipation).   traMADol 50 MG tablet Commonly known as: ULTRAM Take 1 tablet (50 mg total) by mouth every 6 (six) hours as needed for severe pain.   VITAMIN C PO Take 1 tablet by mouth daily.         No Known Allergies  Consultations:  GI    Procedures/Studies: DG Chest 1 View  Result Date: 04/05/2019 CLINICAL DATA:  Fever and shortness of breath. EXAM: CHEST  1 VIEW COMPARISON:  None. FINDINGS: Decreased lung volumes are seen which is likely, in part, secondary to suboptimal patient inspiration. Mild atelectasis is noted within the bilateral lung bases. Mild blunting of the bilateral costophrenic angles is seen. No pneumothorax is identified. The heart size and mediastinal contours are within normal limits. The visualized skeletal structures are unremarkable. IMPRESSION: 1. Low lung volumes with mild bibasilar atelectasis and very small bilateral pleural effusions. Electronically Signed   By: Aram Candela M.D.   On: 04/05/2019 18:13   DG Abd 1 View  Result Date: 04/07/2019 CLINICAL DATA:  Abdominal distension. EXAM: ABDOMEN - 1 VIEW COMPARISON:  None. FINDINGS: The bowel gas pattern is normal. No radio-opaque calculi or other significant radiographic abnormality are seen. IMPRESSION: No evidence of bowel obstruction or ileus. Electronically Signed   By: Lupita Raider M.D.   On: 04/07/2019 13:18   US Abdomen Complete  Result Date: 04/03/2019 CLINICAL DATA:  Elevated liver function tests, elevated  lipase, abdominal pain. EXAM: ABDOMEN ULTRASOUND COMPLETE COMPARISON:  None. FINDINGS: Gallbladder: Decompressed. No gallstones or wall thickening visualized. No sonographic Murphy sign noted by sonographer. Common bile duct: Diameter: 7 mm, upper normal for age. Liver: No focal lesion identified. Coarse echotexture. Portal vein is patent on color Doppler imaging with normal direction of blood flow towards the liver. IVC: Not visualized. Pancreas: Not visualized due to overlying bowel gas. Spleen: Size and appearance within normal limits. Right Kidney: Length: 9.9 cm. Thin cortex. Echogenicity within normal limits. No mass or hydronephrosis visualized. Left Kidney: Length: 11.7 cm. Thin cortex. Echogenicity within normal limits. Mild hydronephrosis. No mass visualized. Abdominal aorta: Not visualized due to overlying bowel gas. Other findings: None. Study limited by body habitus and a significant amount of bowel gas. IMPRESSION: 1. No gallstones identified.  No evidence of acute cholecystitis. 2. Liver with coarse echotexture raising the possibility of underlying cirrhosis. No focal liver lesion identified. 3. Common bile duct upper limits of normal in caliber at 7 mm diameter. No intrahepatic bile duct dilatation appreciated. 4. Mild LEFT renal hydronephrosis, of uncertain chronicity. 5. Both kidneys with thin cortices suggesting some degree of chronic medical renal disease and/or renal artery atherosclerosis. 6. Study limited by body habitus and a significant amount of bowel gas. Pancreas and abdominal aorta not visualized due to overlying bowel gas. Electronically Signed   By: Bary Richard M.D.   On: 04/03/2019 15:58   MR ABDOMEN MRCP WO CONTRAST  Result Date: 04/03/2019 CLINICAL DATA:  Elevated  lipase, possible pancreatitis EXAM: MRI ABDOMEN WITHOUT CONTRAST  (INCLUDING MRCP) TECHNIQUE: Multiplanar multisequence MR imaging of the abdomen was performed. Heavily T2-weighted images of the biliary and  pancreatic ducts were obtained, and three-dimensional MRCP images were rendered by post processing. COMPARISON:  Ultrasound of the abdomen from earlier in the same day. FINDINGS: Lower chest: Within normal limits. Hepatobiliary: Liver demonstrates a cyst adjacent to the fundus of the gallbladder measuring 14 mm. No focal mass lesion is noted. No biliary ductal dilatation is seen. No definitive filling defects are noted within the common bile duct. The gallbladder is partially distended. No definitive gallstones are seen. Pancreas: Pancreas is well visualized without focal mass. Peripancreatic inflammatory changes and edema are noted which extend along the lateral aspect of Gerota's fascia on the left as well as surround the second portion of the duodenum extending inferiorly. Spleen:  Spleen is well visualized and within normal limits. Adrenals/Urinary Tract: Adrenal glands are unremarkable. Right kidney shows no obstructive changes. Left kidney demonstrates a septated cyst in the lower pole as well as a parapelvic cyst. No obstructive changes are seen. The bladder is not visualized on this exam. Stomach/Bowel: Visualized bowel structures show no acute abnormality. Vascular/Lymphatic: No aneurysmal dilatation is seen. No significant lymphadenopathy is noted. Other:  No free fluid is seen. Musculoskeletal: Bony structures appear within normal limits. IMPRESSION: No evidence of cholelithiasis or biliary ductal dilatation. Changes consistent with pancreatitis with surrounding edema as described. No pancreatic mass is seen. Hepatic cyst and renal cysts as described. Electronically Signed   By: Alcide Clever M.D.   On: 04/03/2019 21:13      Procedures:  ERCP  Subjective: Patient is feeling better, no nausea or vomiting and tolerating po well. Mild abdominal pain improved with analgesics. He has been out of bed and ambulating.   Discharge Exam: Vitals:   04/09/19 2131 04/10/19 0515  BP: (!) 128/95 (!) 128/96   Pulse: 98 88  Resp: 18 18  Temp: 99.3 F (37.4 C) 98.4 F (36.9 C)  SpO2: 94% 90%   Vitals:   04/09/19 0536 04/09/19 1439 04/09/19 2131 04/10/19 0515  BP: (!) 144/95 124/84 (!) 128/95 (!) 128/96  Pulse: 100 96 98 88  Resp: 18 15 18 18   Temp: 98.4 F (36.9 C) 98.9 F (37.2 C) 99.3 F (37.4 C) 98.4 F (36.9 C)  TempSrc: Oral Oral Oral Oral  SpO2: 95% 95% 94% 90%  Weight:      Height:        General: Not in pain or dyspnea  Neurology: Awake and alert, non focal  E ENT: no pallor, no icterus, oral mucosa moist Cardiovascular: No JVD. S1-S2 present, rhythmic, no gallops, rubs, or murmurs. No lower extremity edema. Pulmonary: positive breath sounds bilaterally, adequate air movement, no wheezing, rhonchi or rales. Gastrointestinal. Abdomen with no organomegaly, non tender, no rebound or guarding Skin. No rashes Musculoskeletal: no joint deformities   The results of significant diagnostics from this hospitalization (including imaging, microbiology, ancillary and laboratory) are listed below for reference.     Microbiology: Recent Results (from the past 240 hour(s))  SARS CORONAVIRUS 2 (TAT 6-24 HRS) Nasopharyngeal Nasopharyngeal Swab     Status: None   Collection Time: 04/03/19  4:29 PM   Specimen: Nasopharyngeal Swab  Result Value Ref Range Status   SARS Coronavirus 2 NEGATIVE NEGATIVE Final    Comment: (NOTE) SARS-CoV-2 target nucleic acids are NOT DETECTED. The SARS-CoV-2 RNA is generally detectable in upper and lower respiratory specimens during  the acute phase of infection. Negative results do not preclude SARS-CoV-2 infection, do not rule out co-infections with other pathogens, and should not be used as the sole basis for treatment or other patient management decisions. Negative results must be combined with clinical observations, patient history, and epidemiological information. The expected result is Negative. Fact Sheet for  Patients: HairSlick.no Fact Sheet for Healthcare Providers: quierodirigir.com This test is not yet approved or cleared by the [place] FDA and  has been authorized for detection and/or diagnosis of SARS-CoV-2 by FDA under an Emergency Use Authorization (EUA). This EUA will remain  in effect (meaning this test can be used) for the duration of the COVID-19 declaration under Section 56 4(b)(1) of the Act, 21 U.S.C. section 360bbb-3(b)(1), unless the authorization is terminated or revoked sooner. Performed at [facility], [address] [place], [place] [address]      Labs: BNP (last 3 results) No results for input(s): BNP in the last 8760 hours. Basic Metabolic Panel: Recent Labs  Lab 04/06/19 0455 04/07/19 0406 04/08/19 0225 04/09/19 0913 04/10/19 0156  NA 136 138 136 135 134*  K 3.8 4.1 4.0 3.6 3.8  CL 102 102 99 100 101  CO2 25 28 26 23 24   GLUCOSE 108* 113* 114* 124* 132*  BUN 8 6 6 8 12   CREATININE 1.40* 1.36* 1.31* 1.54* 1.51*  CALCIUM 8.3* 8.9 8.9 9.1 8.9   Liver Function Tests: Recent Labs  Lab 04/03/19 1041 04/04/19 0248 04/05/19 1427 04/06/19 0455 04/07/19 0406  AST 1,168* 512* 86* 51* 36  ALT 920* 754* 324* 243* 190*  ALKPHOS 384* 344* 248* 200* 214*  BILITOT 3.8* 2.1* 1.6* 1.8* 1.5*  PROT 8.3* 7.1 6.9 6.6 7.3  ALBUMIN 4.1 3.4* 3.3* 2.9* 3.0*   Recent Labs  Lab 04/03/19 1041 04/06/19 0455  LIPASE 6,430* 76*   No results for input(s): AMMONIA in the last 168 hours. CBC: Recent Labs  Lab 04/03/19 1041 04/04/19 0248 04/06/19 0455 04/07/19 0406 04/08/19 0225  WBC 9.7 7.1 9.7 8.4 7.9  NEUTROABS  --   --  7.5 5.9 5.4  HGB 17.4* 15.8 14.7 15.1 14.7  HCT 52.8* 48.3 44.8 45.7 43.5  MCV 94.3 94.7 94.7 94.2 95.0  PLT 244 217 174 193 226   Cardiac Enzymes: No results for input(s): CKTOTAL, CKMB, CKMBINDEX, TROPONINI in the last 168 hours. BNP: Invalid input(s): POCBNP CBG: No  results for input(s): GLUCAP in the last 168 hours. D-Dimer No results for input(s): DDIMER in the last 72 hours. Hgb A1c No results for input(s): HGBA1C in the last 72 hours. Lipid Profile No results for input(s): CHOL, HDL, LDLCALC, TRIG, CHOLHDL, LDLDIRECT in the last 72 hours. Thyroid function studies No results for input(s): TSH, T4TOTAL, T3FREE, THYROIDAB in the last 72 hours.  Invalid input(s): FREET3 Anemia work up No results for input(s): VITAMINB12, FOLATE, FERRITIN, TIBC, IRON, RETICCTPCT in the last 72 hours. Urinalysis    Component Value Date/Time   COLORURINE AMBER (A) 04/03/2019 1322   APPEARANCEUR CLEAR 04/03/2019 1322   LABSPEC 1.010 04/03/2019 1322   PHURINE 6.0 04/03/2019 1322   GLUCOSEU NEGATIVE 04/03/2019 1322   HGBUR NEGATIVE 04/03/2019 1322   BILIRUBINUR NEGATIVE 04/03/2019 1322   KETONESUR NEGATIVE 04/03/2019 1322   PROTEINUR 30 (A) 04/03/2019 1322   NITRITE NEGATIVE 04/03/2019 1322   LEUKOCYTESUR NEGATIVE 04/03/2019 1322   Sepsis Labs Invalid input(s): PROCALCITONIN,  WBC,  LACTICIDVEN Microbiology Recent Results (from the past 240 hour(s))  SARS CORONAVIRUS 2 (  TAT 6-24 HRS) Nasopharyngeal Nasopharyngeal Swab     Status: None   Collection Time: 04/03/19  4:29 PM   Specimen: Nasopharyngeal Swab  Result Value Ref Range Status   SARS Coronavirus 2 NEGATIVE NEGATIVE Final    Comment: (NOTE) SARS-CoV-2 target nucleic acids are NOT DETECTED. The SARS-CoV-2 RNA is generally detectable in upper and lower respiratory specimens during the acute phase of infection. Negative results do not preclude SARS-CoV-2 infection, do not rule out co-infections with other pathogens, and should not be used as the sole basis for treatment or other patient management decisions. Negative results must be combined with clinical observations, patient history, and epidemiological information. The expected result is Negative. Fact Sheet for  Patients: SugarRoll.be Fact Sheet for Healthcare Providers: https://www.woods-mathews.com/ This test is not yet approved or cleared by the [place] FDA and  has been authorized for detection and/or diagnosis of SARS-CoV-2 by FDA under an Emergency Use Authorization (EUA). This EUA will remain  in effect (meaning this test can be used) for the duration of the COVID-19 declaration under Section 56 4(b)(1) of the Act, 21 U.S.C. section 360bbb-3(b)(1), unless the authorization is terminated or revoked sooner. Performed at [facility], [place] [address] [place], [place] [address]      Time coordinating discharge: 45 minutes  SIGNED:   Tawni Millers, MD  [facility] 04/10/2019, 7:57 AM

## 2019-04-10 NOTE — Progress Notes (Signed)
Subjective: Tolerating diet. Abdominal pain improving.  Objective: Vital signs in last 24 hours: Temp:  [98.4 F (36.9 C)-99.3 F (37.4 C)] 98.4 F (36.9 C) (03/18 0515) Pulse Rate:  [88-98] 88 (03/18 0515) Resp:  [15-18] 18 (03/18 0515) BP: (124-128)/(84-96) 128/96 (03/18 0515) SpO2:  [90 %-95 %] 90 % (03/18 0515) Weight change:  Last BM Date: 04/09/19  PE: GEN:  NAD  Labs: CBC    Component Value Date/Time   WBC 7.9 04/08/2019 0225   RBC 4.58 04/08/2019 0225   HGB 14.7 04/08/2019 0225   HCT 43.5 04/08/2019 0225   PLT 226 04/08/2019 0225   MCV 95.0 04/08/2019 0225   MCH 32.1 04/08/2019 0225   MCHC 33.8 04/08/2019 0225   RDW 13.9 04/08/2019 0225   LYMPHSABS 1.6 04/08/2019 0225   MONOABS 0.8 04/08/2019 0225   EOSABS 0.0 04/08/2019 0225   BASOSABS 0.0 04/08/2019 0225   CMP     Component Value Date/Time   NA 134 (L) 04/10/2019 0156   K 3.8 04/10/2019 0156   CL 101 04/10/2019 0156   CO2 24 04/10/2019 0156   GLUCOSE 132 (H) 04/10/2019 0156   BUN 12 04/10/2019 0156   CREATININE 1.51 (H) 04/10/2019 0156   CALCIUM 8.9 04/10/2019 0156   PROT 7.3 04/07/2019 0406   ALBUMIN 3.0 (L) 04/07/2019 0406   AST 36 04/07/2019 0406   ALT 190 (H) 04/07/2019 0406   ALKPHOS 214 (H) 04/07/2019 0406   BILITOT 1.5 (H) 04/07/2019 0406   GFRNONAA 52 (L) 04/10/2019 0156   GFRAA >60 04/10/2019 0156   Assessment:  1. Pancreatitis, acute, suspect passed bile duct stone (though no stone seen in GB or CBD). 2. Ileus, likely from #1 above and some contribution from narcotics. 3. Abdominal Pain, likely from #1 and #2 above.  Much improved. 4. Elevated LFTs, passed cbd stone?, no choledocholithiasis or cholelithiasis seen on imaging. 5. Elevated Cr, downtrending.   Plan:  1.  Discharge home today. 2.  We will set up follow up with [facility] (Dr. Marca Ancona) in the next few weeks.   Todd Buchanan 04/10/2019, 10:15 AM   Cell [phone] If no answer or after 5 PM call  [phone]

## 2019-04-10 NOTE — Plan of Care (Signed)

## 2019-04-10 NOTE — Discharge Planning (Signed)
Patient discharged home in stable condition. Verbalizes understanding of all discharge instructions, including home medications and follow up appointments. 

## 2019-06-03 ENCOUNTER — Other Ambulatory Visit: Payer: Self-pay | Admitting: Gastroenterology

## 2019-06-03 DIAGNOSIS — F1011 Alcohol abuse, in remission: Secondary | ICD-10-CM

## 2019-06-18 ENCOUNTER — Ambulatory Visit
Admission: RE | Admit: 2019-06-18 | Discharge: 2019-06-18 | Disposition: A | Discharge status: Home / Self Care | Payer: BLUE CROSS/BLUE SHIELD | Source: Ambulatory Visit | Attending: Gastroenterology | Admitting: Gastroenterology

## 2019-06-18 DIAGNOSIS — F1011 Alcohol abuse, in remission: Secondary | ICD-10-CM

## 2019-08-07 ENCOUNTER — Ambulatory Visit: Payer: Self-pay | Admitting: General Surgery

## 2019-08-07 NOTE — H&P (Signed)
[place] Appointment: 08/07/2019 9:45 AM Location: [facility] Patient #: [id number] DOB: October 24, 1965 Single / Language: Cleophus Molt / Race: Black or African American Male  History of Present Illness Randall Hiss M. Letha Mirabal MD; 08/07/2019 10:05 AM) The patient is a 54 year old male who presents for evaluation of gall stones. He comes in for long-term follow-up after being initially met in the hospital back in early March for pancreatitis. He was admitted from March 11 2 the 18th. He had radiological evidence of pancreatitis. This is confirmed on MRI. Ultrasound and MRI showed no evidence of cholelithiasis at that time. He had a bump in his LFTs as well as acute kidney injury. His hepatitis panel was negative. Mitochondrial antibody, anti-smooth muscle antibodies, ANA, Alpha I antitrypsin, AFP tumor marker all negative. He had outpatient follow-up with GI. Repeat LFTs after discharge were normal. He still had an elevated creatinine 1.4. He had a repeat ultrasound of his liver with elastography. It did find evidence of a 6 mm gallstone and low risk for fibrosis. He states he's had one mild episode since being discharged. It was in his right upper quadrant. He has been modifying his diet. He denies any chest pain, chest pressure, source of breath, dizziness exertion, TIAs or amaurosis fugax. He hasn't had any alcohol in over 20 years. He had normal coags and the hospital.  I reviewed the gastrologist office note, labs, follow-up ultrasound from my consult note: Cordelle Dahmen is an 54 y.o. male who is here for for worsening abdominal pain. He reports a 46-monthhistory of intermittent upper abdominal pain with nausea, vomiting and abdominal bloating. He states his symptoms generally last for solid 24 hours. The first episode happened about 3 months ago and the pain was very intense. He had multiple episodes of vomiting. The second episode was not as severe with respect to the  amount of emesis. He will get hot and cold during these episodes. It is mainly focused in his upper abdomen and a little bit to the left side. He states that he has lost about 10 pounds over the past few months. [facility] and referred him for an outpatient ultrasound but is not able to be done until March 17. He had a recurrent flare yesterday and it was the most intense one he had had. He had upper abdominal pain, bloating nausea but no fever. He has daily bowel movements. He states now he is having lower abdominal and mid abdominal pain as well as the upper midline pain. Not really having any right upper quadrant pain.  In the ER he was found to have a total bilirubin of 3.8, AST 02/02/1961, ALT 920, alk phos 380 and lipase of 6000. He had an abdominal ultrasound that showed no evidence of gallstones but possible nodular liver. He underwent an MRI last night which demonstrated pancreatitis without any evidence of cholecystitis, choledocholithiasis or biliary obstruction. We were asked to see in consultation because of a possible biliary etiology  He does denies any recent drinking. He has a remote history of drinking alcohol over 20 years ago along with smoking but no longer does that. He denies any prescription medications. He was told that he had high blood pressure and did lifestyle modification a few years ago and lost weight and on follow-up he states that he was told his blood pressure was better and did not need medication. He  states that he may have a history of high cholesterol. He states that he has had a prior hernia repair he thinks that he went through their bellybutton to fix the groin hernia   Problem List/Past Medical Randall Hiss M. Redmond Pulling, MD; 08/07/2019 10:05 AM) SYMPTOMATIC CHOLELITHIASIS (K80.20) CHRONIC KIDNEY DISEASE, UNSPECIFIED CKD STAGE (N18.9) HISTORY OF ALCOHOL USE  (J19.147) HISTORY OF PANCREATITIS (Z87.19)  Past Surgical History Emeline Gins, [place]; 08/07/2019 9:16 AM) Oral Surgery  Diagnostic Studies History Emeline Gins, CMA; 08/07/2019 9:16 AM) Colonoscopy 1-5 years ago  Allergies Emeline Gins, CMA; 08/07/2019 9:16 AM) No Known Drug Allergies [08/07/2019]: Allergies Reconciled  Medication History Emeline Gins, [place]; 08/07/2019 9:17 AM) No Current Medications Medications Reconciled  Social History Emeline Gins, [place]; 08/07/2019 9:16 AM) Alcohol use Remotely quit alcohol use. Caffeine use Coffee. Illicit drug use Remotely quit drug use. Tobacco use Former smoker.  Family History Emeline Gins, [place]; 08/07/2019 9:16 AM) Alcohol Abuse Father. Bleeding disorder Mother. Cerebrovascular Accident Father. Heart Disease Father.  Other Problems Randall Hiss M. Redmond Pulling, MD; 08/07/2019 10:05 AM) Gastroesophageal Reflux Disease Pancreatitis Sleep Apnea     Review of Systems Emeline Gins CMA; 08/07/2019 9:16 AM) General Not Present- Appetite Loss, Chills, Fatigue, Fever, Night Sweats, Weight Gain and Weight Loss. Skin Not Present- Change in Wart/Mole, Dryness, Hives, Jaundice, New Lesions, Non-Healing Wounds, Rash and Ulcer. HEENT Present- Wears glasses/contact lenses. Not Present- Earache, Hearing Loss, Hoarseness, Nose Bleed, Oral Ulcers, Ringing in the Ears, Seasonal Allergies, Sinus Pain, Sore Throat, Visual Disturbances and Yellow Eyes. Respiratory Not Present- Bloody sputum, Chronic Cough, Difficulty Breathing, Snoring and Wheezing. Breast Not Present- Breast Mass, Breast Pain, Nipple Discharge and Skin Changes. Cardiovascular Not Present- Chest Pain, Difficulty Breathing Lying Down, Leg Cramps, Palpitations, Rapid Heart Rate, Shortness of Breath and Swelling of Extremities. Gastrointestinal Present- Bloating and Excessive gas. Not Present- Abdominal Pain, Bloody Stool, Change in Bowel Habits, Chronic diarrhea,  Constipation, Difficulty Swallowing, Gets full quickly at meals, Hemorrhoids, Indigestion, Nausea, Rectal Pain and Vomiting. Male Genitourinary Not Present- Blood in Urine, Change in Urinary Stream, Frequency, Impotence, Nocturia, Painful Urination, Urgency and Urine Leakage. Neurological Not Present- Decreased Memory, Fainting, Headaches, Numbness, Seizures, Tingling, Tremor, Trouble walking and Weakness. Endocrine Not Present- Cold Intolerance, Excessive Hunger, Hair Changes, Heat Intolerance, Hot flashes and New Diabetes.  Vitals Emeline Gins CMA; 08/07/2019 9:16 AM) 08/07/2019 9:16 AM Weight: 227.8 lb Height: 70in Body Surface Area: 2.21 m Body Mass Index: 32.69 kg/m  Temp.: 98.8F  Pulse: 93 (Regular)  BP: 138/94(Sitting, Left Arm, Standard)        Physical Exam Randall Hiss M. Sterling Mondo MD; 08/07/2019 10:02 AM)  The physical exam findings are as follows: Note:obese  General Mental Status-Alert. General Appearance-Consistent with stated age. Hydration-Well hydrated. Voice-Normal.  Head and Neck Head-normocephalic, atraumatic with no lesions or palpable masses. Trachea-midline. Thyroid Gland Characteristics - normal size and consistency.  Eye Eyeball - Bilateral-Extraocular movements intact. Sclera/Conjunctiva - Bilateral-No scleral icterus.  Chest and Lung Exam Chest and lung exam reveals -quiet, even and easy respiratory effort with no use of accessory muscles and on auscultation, normal breath sounds, no adventitious sounds and normal vocal resonance. Inspection Chest Wall - Normal. Back - normal.  Breast - Did not examine.  Cardiovascular Cardiovascular examination reveals -normal heart sounds, regular rate and rhythm with no murmurs and normal pedal pulses bilaterally.  Abdomen Inspection Inspection of the abdomen reveals - No Hernias. Skin - Scar - no surgical scars. Palpation/Percussion Palpation and Percussion of the abdomen  reveal - Soft, Non Tender,  No Rebound tenderness, No Rigidity (guarding) and No hepatosplenomegaly. Auscultation Auscultation of the abdomen reveals - Bowel sounds normal.  Peripheral Vascular Upper Extremity Palpation - Pulses bilaterally normal.  Neurologic Neurologic evaluation reveals -alert and oriented x 3 with no impairment of recent or remote memory. Mental Status-Normal.  Neuropsychiatric The patient's mood and affect are described as -normal. Judgment and Insight-insight is appropriate concerning matters relevant to self.  Musculoskeletal Normal Exam - Left-Upper Extremity Strength Normal and Lower Extremity Strength Normal. Normal Exam - Right-Upper Extremity Strength Normal and Lower Extremity Strength Normal.  Lymphatic Head & Neck  General Head & Neck Lymphatics: Bilateral - Description - Normal. Axillary - Did not examine. Femoral & Inguinal - Did not examine.    Assessment & Plan Randall Hiss M. Tierrah Anastos MD; 08/07/2019 10:06 AM)  SYMPTOMATIC CHOLELITHIASIS (K80.20) Impression: I believe the patient's symptoms are consistent with gallbladder disease.  We discussed gallbladder disease. The patient was given Neurosurgeon. We discussed non-operative and operative management. We discussed the signs & symptoms of acute cholecystitis  I discussed laparoscopic cholecystectomy with IOC in detail. The patient was given educational material as well as diagrams detailing the procedure. We discussed the risks and benefits of a laparoscopic cholecystectomy including, but not limited to bleeding, infection, injury to surrounding structures such as the intestine or liver, bile leak, retained gallstones, need to convert to an open procedure, prolonged diarrhea, blood clots such as DVT, common bile duct injury, anesthesia risks, and possible need for additional procedures. We discussed the typical post-operative recovery course. I explained that the likelihood of  improvement of their symptoms is good.  The patient has elected to proceed with surgery.  This patient encounter took 33 minutes today to perform the following: take history, perform exam, review outside records, interpret imaging, counsel the patient on their diagnosis and document encounter, findings & plan in the EHR  Current Plans Pt Education - Pamphlet Given - Laparoscopic Gallbladder Surgery: discussed with patient and provided information. You are being scheduled for surgery- Our schedulers will call you.  You should hear from our office's scheduling department within 5 working days about the location, date, and time of surgery. We try to make accommodations for patient's preferences in scheduling surgery, but sometimes the OR schedule or the surgeon's schedule prevents [place] from making those accommodations.  If you have not heard from our office [phone]) in 5 working days, call the office and ask for your surgeon's nurse.  If you have other questions about your diagnosis, plan, or surgery, call the office and ask for your surgeon's nurse.   HISTORY OF PANCREATITIS (Z87.19) Impression: I believe his episode of pancreatitis in the hospital back in March was now due to gallstone pancreatitis since there is been evidence of a gallstone found on ultrasound imaging.   CHRONIC KIDNEY DISEASE, UNSPECIFIED CKD STAGE (N18.9)   HISTORY OF ALCOHOL USE (T24.580) Impression: His initial ultrasound in the hospital showed a course echotexture suggestive of cirrhosis. Follow-up imaging such as his MRI, repeat ultrasound showed no splenomegaly, portal hypertension, obvious varices. His platelet count is normal. coags normal. Therefore I don't think he is at significant increased perioperative risk because of his remote history of heavy alcohol use  Leighton Ruff. Redmond Pulling, MD, FACS General, Bariatric, & Minimally Invasive Surgery [facility], [place]

## 2019-09-18 NOTE — Patient Instructions (Addendum)
DUE TO COVID-19 ONLY ONE VISITOR IS ALLOWED TO COME WITH YOU AND STAY IN THE WAITING ROOM ONLY DURING PRE OP AND PROCEDURE DAY OF SURGERY. THE 1 VISITOR  MAY VISIT WITH YOU AFTER SURGERY IN YOUR PRIVATE ROOM DURING VISITING HOURS ONLY!  YOU NEED TO HAVE A COVID 19 TEST ON_8/31______ @_8 :30______, THIS TEST MUST BE DONE BEFORE SURGERY,  COVID TESTING SITE [address] [place] , IT IS ON THE RIGHT GOING OUT [address] APPROXIMATELY  2 MINUTES PAST [employer] ON THE RIGHT. ONCE YOUR COVID TEST IS COMPLETED,  PLEASE BEGIN THE QUARANTINE INSTRUCTIONS AS OUTLINED IN YOUR HANDOUT.                Todd Buchanan    Your procedure is scheduled on: 09/26/19   Report to [facility] Main  Entrance   Report to Short Stay at 5:30 AM     Call this number if you have problems the morning of surgery [phone]   . BRUSH YOUR TEETH MORNING OF SURGERY AND RINSE YOUR MOUTH OUT, NO CHEWING GUM CANDY OR MINTS.  No food after midnight.    You may have clear liquid until 4:30 AM.    At 4:30 AM drink pre surgery drink.   Nothing by mouth after 4:30 AM.    Take these medicines the morning of surgery with A SIP OF WATER: Protonix                                 You may not have any metal on your body including              piercings  Do not wear jewelry,  lotions, powders or deodorant              Men may shave face and neck.   Do not bring valuables to the hospital. [facility] IS NOT             RESPONSIBLE   FOR VALUABLES.  Contacts, dentures or bridgework may not be worn into surgery.       Patients discharged the day of surgery will not be allowed to drive home.   IF YOU ARE HAVING SURGERY AND GOING HOME THE SAME DAY, YOU MUST HAVE AN ADULT TO DRIVE YOU HOME AND BE WITH YOU FOR 24 HOURS.   YOU MAY GO HOME BY TAXI OR UBER OR ORTHERWISE, BUT AN ADULT MUST ACCOMPANY YOU HOME AND STAY WITH YOU FOR 24 HOURS.  Name and phone number of your  driver:  Special Instructions: N/A              Please read over the following fact sheets you were given: _____________________________________________________________________             [facility] - Preparing for Surgery  Before surgery, you can play an important role.   Because skin is not sterile, your skin needs to be as free of germs as possible .  You can reduce the number of germs on your skin by washing with CHG (chlorahexidine gluconate) soap before surgery .  CHG is an antiseptic cleaner which kills germs and bonds with the skin to continue killing germs even after washing. Please DO NOT use if you have an allergy to CHG or antibacterial soaps.   If your skin becomes reddened/irritated stop using the CHG and inform your nurse when you arrive  at Short Stay.   You may shave your face/neck.  Please follow these instructions carefully:  1.  Shower with CHG Soap the night before surgery and the  morning of Surgery.  2.  If you choose to wash your hair, wash your hair first as usual with your  normal  shampoo.  3.  After you shampoo, rinse your hair and body thoroughly to remove the  shampoo.                                        4.  Use CHG as you would any other liquid soap.  You can apply chg directly  to the skin and wash                       Gently with a scrungie or clean washcloth.  5.  Apply the CHG Soap to your body ONLY FROM THE NECK DOWN.   Do not use on face/ open                           Wound or open sores. Avoid contact with eyes, ears mouth and genitals (private parts).                       Wash face,  Genitals (private parts) with your normal soap.             6.  Wash thoroughly, paying special attention to the area where your surgery  will be performed.  7.  Thoroughly rinse your body with warm water from the neck down.  8.  DO NOT shower/wash with your normal soap after using and rinsing off  the CHG Soap.             9.  Pat yourself dry with a clean  towel.            10.  Wear clean pajamas.            11.  Place clean sheets on your bed the night of your first shower and do not  sleep with pets. Day of Surgery : Do not apply any lotions/deodorants the morning of surgery.  Please wear clean clothes to the hospital/surgery center.  FAILURE TO FOLLOW THESE INSTRUCTIONS MAY RESULT IN THE CANCELLATION OF YOUR SURGERY PATIENT SIGNATURE_________________________________  NURSE SIGNATURE__________________________________  ________________________________________________________________________

## 2019-09-19 ENCOUNTER — Encounter (HOSPITAL_COMMUNITY)
Admission: RE | Admit: 2019-09-19 | Discharge: 2019-09-19 | Disposition: A | Discharge status: Home / Self Care | Payer: BLUE CROSS/BLUE SHIELD | Source: Ambulatory Visit | Attending: General Surgery | Admitting: General Surgery

## 2019-09-19 ENCOUNTER — Encounter (HOSPITAL_COMMUNITY): Payer: Self-pay

## 2019-09-19 ENCOUNTER — Other Ambulatory Visit: Payer: Self-pay

## 2019-09-19 DIAGNOSIS — Z01812 Encounter for preprocedural laboratory examination: Secondary | ICD-10-CM | POA: Insufficient documentation

## 2019-09-19 LAB — COMPREHENSIVE METABOLIC PANEL
ALT: 23 U/L (ref 0–44)
AST: 23 U/L (ref 15–41)
Albumin: 4.2 g/dL (ref 3.5–5.0)
Alkaline Phosphatase: 81 U/L (ref 38–126)
Anion gap: 10 (ref 5–15)
BUN: 13 mg/dL (ref 6–20)
CO2: 26 mmol/L (ref 22–32)
Calcium: 9.3 mg/dL (ref 8.9–10.3)
Chloride: 104 mmol/L (ref 98–111)
Creatinine, Ser: 1.5 mg/dL — ABNORMAL HIGH (ref 0.61–1.24)
GFR calc Af Amer: >60 mL/min (ref >60)
GFR calc non Af Amer: 52 mL/min — ABNORMAL LOW (ref >60)
Glucose, Bld: 105 mg/dL — ABNORMAL HIGH (ref 70–99)
Potassium: 3.9 mmol/L (ref 3.5–5.1)
Sodium: 140 mmol/L (ref 135–145)
Total Bilirubin: 0.6 mg/dL (ref 0.3–1.2)
Total Protein: 8.1 g/dL (ref 6.5–8.1)

## 2019-09-19 LAB — CBC WITH DIFFERENTIAL/PLATELET
Abs Immature Granulocytes: 0.01 10*3/uL (ref 0.00–0.07)
Basophils Absolute: 0 10*3/uL (ref 0.0–0.1)
Basophils Relative: 0 %
Eosinophils Absolute: 0 10*3/uL (ref 0.0–0.5)
Eosinophils Relative: 0 %
HCT: 49.9 % (ref 39.0–52.0)
Hemoglobin: 16.4 g/dL (ref 13.0–17.0)
Immature Granulocytes: 0 %
Lymphocytes Relative: 47 %
Lymphs Abs: 1.6 10*3/uL (ref 0.7–4.0)
MCH: 30.9 pg (ref 26.0–34.0)
MCHC: 32.9 g/dL (ref 30.0–36.0)
MCV: 94.2 fL (ref 80.0–100.0)
Monocytes Absolute: 0.3 10*3/uL (ref 0.1–1.0)
Monocytes Relative: 8 %
Neutro Abs: 1.6 10*3/uL — ABNORMAL LOW (ref 1.7–7.7)
Neutrophils Relative %: 45 %
Platelets: 179 10*3/uL (ref 150–400)
RBC: 5.3 MIL/uL (ref 4.22–5.81)
RDW: 13.9 % (ref 11.5–15.5)
WBC: 3.5 10*3/uL — ABNORMAL LOW (ref 4.0–10.5)
nRBC: 0 % (ref 0.0–0.2)

## 2019-09-19 NOTE — Progress Notes (Signed)
COVID Vaccine Completed:No Date COVID Vaccine completed: COVID vaccine manufacturer: Pfizer    Moderna   [employer]   PCP - [facility] [place] Cardiologist - no  Chest x-ray - 04/05/19 EKG - 04/07/19 Stress Test - no ECHO - no Cardiac Cath - no  Sleep Study - Yes CPAP - no- he uses and adjustable bed and is able to sleep with the head up.  Fasting Blood Sugar - NA Checks Blood Sugar _____ times a day  Blood Thinner Instructions:NA Aspirin Instructions: Last Dose:  Anesthesia review:   Patient denies shortness of breath, fever, cough and chest pain at PAT appointment Yes   Patient verbalized understanding of instructions that were given to them at the PAT appointment. Patient was also instructed that they will need to review over the PAT instructions again at home before surgery. Yes  Pt's BP was elevated at the PAT visit. Diastolic was over 100 before using the restroom and after, He has no C/O chest pain and says that he feels fine. He had stopped taking medication after a significant wt loss and said his BP had been normal.  I told him to call his PCP after his blood draw today.  He has medication at home.  He is an active person and works out.  He has no SOB climbing stairs, doing work or with ADLs

## 2019-09-23 ENCOUNTER — Other Ambulatory Visit (HOSPITAL_COMMUNITY)
Admission: RE | Admit: 2019-09-23 | Discharge: 2019-09-23 | Disposition: A | Discharge status: Home / Self Care | Payer: BLUE CROSS/BLUE SHIELD | Source: Ambulatory Visit | Attending: General Surgery | Admitting: General Surgery

## 2019-09-23 DIAGNOSIS — Z20822 Contact with and (suspected) exposure to covid-19: Secondary | ICD-10-CM | POA: Insufficient documentation

## 2019-09-23 DIAGNOSIS — Z01812 Encounter for preprocedural laboratory examination: Secondary | ICD-10-CM | POA: Insufficient documentation

## 2019-09-23 LAB — SARS CORONAVIRUS 2 (TAT 6-24 HRS): SARS Coronavirus 2: NEGATIVE

## 2019-09-26 ENCOUNTER — Encounter (HOSPITAL_COMMUNITY): Payer: Self-pay | Admitting: General Surgery

## 2019-09-26 ENCOUNTER — Ambulatory Visit (HOSPITAL_COMMUNITY): Payer: BLUE CROSS/BLUE SHIELD | Admitting: Physician Assistant

## 2019-09-26 ENCOUNTER — Ambulatory Visit (HOSPITAL_COMMUNITY)
Admission: RE | Admit: 2019-09-26 | Discharge: 2019-09-26 | Disposition: A | Discharge status: Home / Self Care | Payer: BLUE CROSS/BLUE SHIELD | Source: Ambulatory Visit | Attending: General Surgery | Admitting: General Surgery

## 2019-09-26 ENCOUNTER — Ambulatory Visit (HOSPITAL_COMMUNITY): Payer: BLUE CROSS/BLUE SHIELD | Admitting: Certified Registered"

## 2019-09-26 ENCOUNTER — Encounter (HOSPITAL_COMMUNITY)
Admission: RE | Disposition: A | Discharge status: Home / Self Care | Payer: Self-pay | Source: Ambulatory Visit | Attending: General Surgery

## 2019-09-26 DIAGNOSIS — N189 Chronic kidney disease, unspecified: Secondary | ICD-10-CM | POA: Insufficient documentation

## 2019-09-26 DIAGNOSIS — I509 Heart failure, unspecified: Secondary | ICD-10-CM | POA: Diagnosis not present

## 2019-09-26 DIAGNOSIS — K811 Chronic cholecystitis: Secondary | ICD-10-CM | POA: Insufficient documentation

## 2019-09-26 DIAGNOSIS — I130 Hypertensive heart and chronic kidney disease with heart failure and stage 1 through stage 4 chronic kidney disease, or unspecified chronic kidney disease: Secondary | ICD-10-CM | POA: Diagnosis not present

## 2019-09-26 DIAGNOSIS — G4730 Sleep apnea, unspecified: Secondary | ICD-10-CM | POA: Insufficient documentation

## 2019-09-26 DIAGNOSIS — K219 Gastro-esophageal reflux disease without esophagitis: Secondary | ICD-10-CM | POA: Insufficient documentation

## 2019-09-26 DIAGNOSIS — Z87891 Personal history of nicotine dependence: Secondary | ICD-10-CM | POA: Diagnosis not present

## 2019-09-26 LAB — COMPREHENSIVE METABOLIC PANEL
ALT: 30 U/L (ref 0–44)
AST: 34 U/L (ref 15–41)
Albumin: 3.8 g/dL (ref 3.5–5.0)
Alkaline Phosphatase: 73 U/L (ref 38–126)
Anion gap: 10 (ref 5–15)
BUN: 17 mg/dL (ref 6–20)
CO2: 22 mmol/L (ref 22–32)
Calcium: 8.7 mg/dL — ABNORMAL LOW (ref 8.9–10.3)
Chloride: 104 mmol/L (ref 98–111)
Creatinine, Ser: 1.48 mg/dL — ABNORMAL HIGH (ref 0.61–1.24)
GFR calc Af Amer: >60 mL/min (ref >60)
GFR calc non Af Amer: 53 mL/min — ABNORMAL LOW (ref >60)
Glucose, Bld: 114 mg/dL — ABNORMAL HIGH (ref 70–99)
Potassium: 4 mmol/L (ref 3.5–5.1)
Sodium: 136 mmol/L (ref 135–145)
Total Bilirubin: 0.8 mg/dL (ref 0.3–1.2)
Total Protein: 7.3 g/dL (ref 6.5–8.1)

## 2019-09-26 LAB — TYPE AND SCREEN
ABO/RH(D): B POS
Antibody Screen: NEGATIVE

## 2019-09-26 LAB — ABO/RH: ABO/RH(D): B POS

## 2019-09-26 SURGERY — CHOLECYSTECTOMY, ROBOT-ASSISTED, LAPAROSCOPIC
Anesthesia: General | Site: Abdomen

## 2019-09-26 MED ORDER — PROPOFOL 10 MG/ML IV BOLUS
INTRAVENOUS | Status: DC | PRN
  Administered 2019-09-26: 150 mg via INTRAVENOUS

## 2019-09-26 MED ORDER — DEXAMETHASONE SODIUM PHOSPHATE 10 MG/ML IJ SOLN
INTRAMUSCULAR | Status: DC | PRN
  Administered 2019-09-26: 4 mg via INTRAVENOUS

## 2019-09-26 MED ORDER — FENTANYL CITRATE (PF) 100 MCG/2ML IJ SOLN
INTRAMUSCULAR | Status: DC | PRN
Start: 2019-09-26 — End: 2019-09-26
  Administered 2019-09-26 (×3): 50 ug via INTRAVENOUS

## 2019-09-26 MED ORDER — ROCURONIUM BROMIDE 10 MG/ML (PF) SYRINGE
PREFILLED_SYRINGE | INTRAVENOUS | Status: DC | PRN
  Administered 2019-09-26: 100 mg via INTRAVENOUS

## 2019-09-26 MED ORDER — ONDANSETRON HCL 4 MG/2ML IJ SOLN
INTRAMUSCULAR | Status: AC
  Filled 2019-09-26: qty 2

## 2019-09-26 MED ORDER — CHLORHEXIDINE GLUCONATE 0.12 % MT SOLN
15.0000 mL | Freq: Once | OROMUCOSAL | Status: AC
  Administered 2019-09-26: 15 mL via OROMUCOSAL

## 2019-09-26 MED ORDER — MIDAZOLAM HCL 2 MG/2ML IJ SOLN
INTRAMUSCULAR | Status: DC | PRN
  Administered 2019-09-26: 2 mg via INTRAVENOUS

## 2019-09-26 MED ORDER — FENTANYL CITRATE (PF) 100 MCG/2ML IJ SOLN
INTRAMUSCULAR | Status: AC
  Filled 2019-09-26: qty 2

## 2019-09-26 MED ORDER — MIDAZOLAM HCL 2 MG/2ML IJ SOLN
INTRAMUSCULAR | Status: AC
  Filled 2019-09-26: qty 2

## 2019-09-26 MED ORDER — ROCURONIUM BROMIDE 10 MG/ML (PF) SYRINGE
PREFILLED_SYRINGE | INTRAVENOUS | Status: AC
  Filled 2019-09-26: qty 10

## 2019-09-26 MED ORDER — LIDOCAINE 2% (20 MG/ML) 5 ML SYRINGE
INTRAMUSCULAR | Status: DC | PRN
  Administered 2019-09-26: 50 mg via INTRAVENOUS

## 2019-09-26 MED ORDER — LACTATED RINGERS IV SOLN: INTRAVENOUS | Status: DC

## 2019-09-26 MED ORDER — AMISULPRIDE (ANTIEMETIC) 5 MG/2ML IV SOLN
INTRAVENOUS | Status: AC
  Filled 2019-09-26: qty 4

## 2019-09-26 MED ORDER — KETOROLAC TROMETHAMINE 15 MG/ML IJ SOLN
INTRAMUSCULAR | Status: DC | PRN
  Administered 2019-09-26: 15 mg via INTRAVENOUS

## 2019-09-26 MED ORDER — LIDOCAINE 2% (20 MG/ML) 5 ML SYRINGE
INTRAMUSCULAR | Status: AC
  Filled 2019-09-26: qty 5

## 2019-09-26 MED ORDER — LACTATED RINGERS IR SOLN
Status: DC | PRN
  Administered 2019-09-26: 1000 mL

## 2019-09-26 MED ORDER — SUGAMMADEX SODIUM 500 MG/5ML IV SOLN
INTRAVENOUS | Status: AC
  Filled 2019-09-26: qty 5

## 2019-09-26 MED ORDER — ACETAMINOPHEN 500 MG PO TABS
1000.0000 mg | ORAL_TABLET | ORAL | Status: AC
  Administered 2019-09-26: 1000 mg via ORAL
  Filled 2019-09-26: qty 2

## 2019-09-26 MED ORDER — OXYCODONE HCL 5 MG PO TABS
5.0000 mg | ORAL_TABLET | Freq: Once | ORAL | Status: AC | PRN
  Administered 2019-09-26: 5 mg via ORAL

## 2019-09-26 MED ORDER — AMISULPRIDE (ANTIEMETIC) 5 MG/2ML IV SOLN
10.0000 mg | Freq: Once | INTRAVENOUS | Status: AC
  Administered 2019-09-26: 10 mg via INTRAVENOUS

## 2019-09-26 MED ORDER — PHENYLEPHRINE 40 MCG/ML (10ML) SYRINGE FOR IV PUSH (FOR BLOOD PRESSURE SUPPORT)
PREFILLED_SYRINGE | INTRAVENOUS | Status: AC
  Filled 2019-09-26: qty 10

## 2019-09-26 MED ORDER — CHLORHEXIDINE GLUCONATE CLOTH 2 % EX PADS: 6.0000 | MEDICATED_PAD | Freq: Once | CUTANEOUS | Status: DC

## 2019-09-26 MED ORDER — ORAL CARE MOUTH RINSE: 15.0000 mL | Freq: Once | OROMUCOSAL | Status: AC

## 2019-09-26 MED ORDER — GABAPENTIN 300 MG PO CAPS
300.0000 mg | ORAL_CAPSULE | ORAL | Status: AC
  Administered 2019-09-26: 300 mg via ORAL
  Filled 2019-09-26: qty 1

## 2019-09-26 MED ORDER — LABETALOL HCL 5 MG/ML IV SOLN
INTRAVENOUS | Status: AC
  Administered 2019-09-26: 5 mg via INTRAVENOUS
  Filled 2019-09-26: qty 4

## 2019-09-26 MED ORDER — SODIUM CHLORIDE 0.9 % IV SOLN
2.0000 g | INTRAVENOUS | Status: AC
  Administered 2019-09-26: 2 g via INTRAVENOUS
  Filled 2019-09-26: qty 2

## 2019-09-26 MED ORDER — OXYCODONE HCL 5 MG PO TABS: 5.0000 mg | ORAL_TABLET | Freq: Four times a day (QID) | ORAL | 0 refills | Status: AC | PRN

## 2019-09-26 MED ORDER — BUPIVACAINE-EPINEPHRINE 0.25% -1:200000 IJ SOLN
INTRAMUSCULAR | Status: DC | PRN
  Administered 2019-09-26: 50 mL

## 2019-09-26 MED ORDER — 0.9 % SODIUM CHLORIDE (POUR BTL) OPTIME
TOPICAL | Status: DC | PRN
  Administered 2019-09-26: 1000 mL

## 2019-09-26 MED ORDER — LABETALOL HCL 5 MG/ML IV SOLN: 5.0000 mg | Freq: Once | INTRAVENOUS | Status: AC

## 2019-09-26 MED ORDER — PHENYLEPHRINE 40 MCG/ML (10ML) SYRINGE FOR IV PUSH (FOR BLOOD PRESSURE SUPPORT)
PREFILLED_SYRINGE | INTRAVENOUS | Status: DC | PRN
  Administered 2019-09-26: 120 ug via INTRAVENOUS

## 2019-09-26 MED ORDER — BUPIVACAINE-EPINEPHRINE 0.25% -1:200000 IJ SOLN
INTRAMUSCULAR | Status: AC
  Filled 2019-09-26: qty 1

## 2019-09-26 MED ORDER — FENTANYL CITRATE (PF) 100 MCG/2ML IJ SOLN: 25.0000 ug | INTRAMUSCULAR | Status: DC | PRN

## 2019-09-26 MED ORDER — DEXAMETHASONE SODIUM PHOSPHATE 10 MG/ML IJ SOLN
INTRAMUSCULAR | Status: AC
  Filled 2019-09-26: qty 1

## 2019-09-26 MED ORDER — SUGAMMADEX SODIUM 200 MG/2ML IV SOLN
INTRAVENOUS | Status: DC | PRN
  Administered 2019-09-26: 420 mg via INTRAVENOUS

## 2019-09-26 MED ORDER — KETOROLAC TROMETHAMINE 30 MG/ML IJ SOLN
INTRAMUSCULAR | Status: AC
  Filled 2019-09-26: qty 1

## 2019-09-26 MED ORDER — OXYCODONE HCL 5 MG/5ML PO SOLN: 5.0000 mg | Freq: Once | ORAL | Status: AC | PRN

## 2019-09-26 MED ORDER — INDOCYANINE GREEN 25 MG IV SOLR
7.5000 mg | Freq: Once | INTRAVENOUS | Status: AC
  Administered 2019-09-26: 7.5 mg via INTRAVENOUS
  Filled 2019-09-26: qty 3

## 2019-09-26 MED ORDER — OXYCODONE HCL 5 MG PO TABS
ORAL_TABLET | ORAL | Status: DC
Start: 2019-09-26 — End: 2019-09-26
  Filled 2019-09-26: qty 1

## 2019-09-26 MED ORDER — ONDANSETRON HCL 4 MG/2ML IJ SOLN
INTRAMUSCULAR | Status: DC | PRN
  Administered 2019-09-26: 4 mg via INTRAVENOUS

## 2019-09-26 MED ORDER — PROPOFOL 10 MG/ML IV BOLUS
INTRAVENOUS | Status: AC
  Filled 2019-09-26: qty 20

## 2019-09-26 SURGICAL SUPPLY — 62 items
APPLIER CLIP 5 13 M/L LIGAMAX5 (MISCELLANEOUS)
BENZOIN TINCTURE PRP APPL 2/3 (GAUZE/BANDAGES/DRESSINGS) ×3 IMPLANT
BLADE SURG SZ11 CARB STEEL (BLADE) ×3 IMPLANT
BNDG ADH 1X3 SHEER STRL LF (GAUZE/BANDAGES/DRESSINGS) ×3 IMPLANT
CANNULA REDUC XI 12-8 STAPL (CANNULA) ×1
CANNULA REDUC XI 12-8MM STAPL (CANNULA) ×1
CANNULA REDUCER 12-8 DVNC XI (CANNULA) ×1 IMPLANT
CHLORAPREP W/TINT 26 (MISCELLANEOUS) ×3 IMPLANT
CLIP APPLIE 5 13 M/L LIGAMAX5 (MISCELLANEOUS) IMPLANT
CLIP VESOLOCK LG 6/CT PURPLE (CLIP) ×6 IMPLANT
CLOSURE WOUND 1/2 X4 (GAUZE/BANDAGES/DRESSINGS) ×1
COVER TIP SHEARS 8 DVNC (MISCELLANEOUS) ×1 IMPLANT
COVER TIP SHEARS 8MM DA VINCI (MISCELLANEOUS) ×2
COVER WAND RF STERILE (DRAPES) ×3 IMPLANT
DECANTER SPIKE VIAL GLASS SM (MISCELLANEOUS) ×3 IMPLANT
DRAPE ARM DVNC X/XI (DISPOSABLE) ×4 IMPLANT
DRAPE COLUMN DVNC XI (DISPOSABLE) ×1 IMPLANT
DRAPE DA VINCI XI ARM (DISPOSABLE) ×8
DRAPE DA VINCI XI COLUMN (DISPOSABLE) ×2
DRSG TEGADERM 2-3/8X2-3/4 SM (GAUZE/BANDAGES/DRESSINGS) ×9 IMPLANT
DRSG TEGADERM 4X4.75 (GAUZE/BANDAGES/DRESSINGS) ×3 IMPLANT
ELECT REM PT RETURN 15FT ADLT (MISCELLANEOUS) ×3 IMPLANT
GAUZE SPONGE 2X2 8PLY STRL LF (GAUZE/BANDAGES/DRESSINGS) ×1 IMPLANT
GLOVE BIO SURGEON STRL SZ7.5 (GLOVE) ×9 IMPLANT
GLOVE INDICATOR 8.0 STRL GRN (GLOVE) ×9 IMPLANT
GOWN STRL REUS W/TWL XL LVL3 (GOWN DISPOSABLE) ×6 IMPLANT
GRASPER SUT TROCAR 14GX15 (MISCELLANEOUS) IMPLANT
IRRIGATOR SUCT 8 DISP DVNC XI (IRRIGATION / IRRIGATOR) IMPLANT
IRRIGATOR SUCTION 8MM XI DISP (IRRIGATION / IRRIGATOR)
KIT BASIN OR (CUSTOM PROCEDURE TRAY) ×3 IMPLANT
KIT PROCEDURE DA VINCI SI (MISCELLANEOUS)
KIT PROCEDURE DVNC SI (MISCELLANEOUS) IMPLANT
KIT TURNOVER KIT A (KITS) ×3 IMPLANT
MANIFOLD NEPTUNE II (INSTRUMENTS) ×3 IMPLANT
NEEDLE HYPO 22GX1.5 SAFETY (NEEDLE) ×3 IMPLANT
NEEDLE INSUFFLATION 14GA 120MM (NEEDLE) IMPLANT
PACK CARDIOVASCULAR III (CUSTOM PROCEDURE TRAY) ×3 IMPLANT
POUCH RETRIEVAL ECOSAC 10 (ENDOMECHANICALS) ×2 IMPLANT
POUCH RETRIEVAL ECOSAC 10MM (ENDOMECHANICALS) ×4
SEAL CANN UNIV 5-8 DVNC XI (MISCELLANEOUS) ×4 IMPLANT
SEAL XI 5MM-8MM UNIVERSAL (MISCELLANEOUS) ×8
SET IRRIG TUBING LAPAROSCOPIC (IRRIGATION / IRRIGATOR) ×3 IMPLANT
SOL ANTI FOG 6CC (MISCELLANEOUS) ×1 IMPLANT
SOLUTION ANTI FOG 6CC (MISCELLANEOUS) ×2
SOLUTION ELECTROLUBE (MISCELLANEOUS) ×3 IMPLANT
SPONGE GAUZE 2X2 STER 10/PKG (GAUZE/BANDAGES/DRESSINGS) ×2
SPONGE LAP 18X18 RF (DISPOSABLE) ×3 IMPLANT
STAPLER CANNULA SEAL DVNC XI (STAPLE) IMPLANT
STAPLER CANNULA SEAL XI (STAPLE)
STRIP CLOSURE SKIN 1/2X4 (GAUZE/BANDAGES/DRESSINGS) ×2 IMPLANT
SUT MNCRL AB 4-0 PS2 18 (SUTURE) ×3 IMPLANT
SUT VICRYL 0 TIES 12 18 (SUTURE) IMPLANT
SUT VICRYL 0 UR6 27IN ABS (SUTURE) IMPLANT
SYR 10ML ECCENTRIC (SYRINGE) ×3 IMPLANT
SYR 20ML LL LF (SYRINGE) ×3 IMPLANT
SYS RETRIEVAL 5MM INZII UNIV (BASKET)
SYSTEM RETRIEVL 5MM INZII UNIV (BASKET) IMPLANT
TOWEL OR 17X26 10 PK STRL BLUE (TOWEL DISPOSABLE) ×3 IMPLANT
TOWEL OR NON WOVEN STRL DISP B (DISPOSABLE) ×3 IMPLANT
TRAY FOLEY MTR SLVR 16FR STAT (SET/KITS/TRAYS/PACK) IMPLANT
TROCAR BLADELESS OPT 5 100 (ENDOMECHANICALS) ×3 IMPLANT
TUBING INSUFFLATION 10FT LAP (TUBING) ×3 IMPLANT

## 2019-09-26 NOTE — Anesthesia Procedure Notes (Signed)
Procedure Name: Intubation Date/Time: 09/26/2019 7:36 AM Performed by: Niel Hummer, CRNA Pre-anesthesia Checklist: Patient identified, Emergency Drugs available, Suction available and Patient being monitored Patient Re-evaluated:Patient Re-evaluated prior to induction Oxygen Delivery Method: Circle system utilized Preoxygenation: Pre-oxygenation with 100% oxygen Induction Type: IV induction Ventilation: Mask ventilation without difficulty and Oral airway inserted - appropriate to patient size Laryngoscope Size: Mac and 4 Grade View: Grade I Tube type: Oral Tube size: 7.0 mm Number of attempts: 1 Airway Equipment and Method: Stylet Placement Confirmation: ETT inserted through vocal cords under direct vision,  positive ETCO2 and breath sounds checked- equal and bilateral Secured at: 24 cm Tube secured with: Tape Dental Injury: Teeth and Oropharynx as per pre-operative assessment

## 2019-09-26 NOTE — H&P (Signed)
CC: here for surgery  Requesting provider: n/a  HPI: Todd Buchanan is an 54 y.o. male who is here for robotic cholecystectomy poss ioc. Denies any medical changes since seen in clinic.   The patient is a 54 year old male who presents for evaluation of gall stones. He comes in for long-term follow-up after being initially met in the hospital back in early March for pancreatitis. He was admitted from March 11 2 the 18th. He had radiological evidence of pancreatitis. This is confirmed on MRI. Ultrasound and MRI showed no evidence of cholelithiasis at that time. He had a bump in his LFTs as well as acute kidney injury. His hepatitis panel was negative. Mitochondrial antibody, anti-smooth muscle antibodies, ANA, Alpha I antitrypsin, AFP tumor marker all negative. He had outpatient follow-up with GI. Repeat LFTs after discharge were normal. He still had an elevated creatinine 1.4. He had a repeat ultrasound of his liver with elastography. It did find evidence of a 6 mm gallstone and low risk for fibrosis. He states he's had one mild episode since being discharged. It was in his right upper quadrant. He has been modifying his diet. He denies any chest pain, chest pressure, source of breath, dizziness exertion, TIAs or amaurosis fugax. He hasn't had any alcohol in over 20 years. He had normal coags and the hospital.  Past Medical History:  Diagnosis Date  . CHF (congestive heart failure) ([place]) 2016   reports lost weight and resolved and was taken off meds  . Pancreatitis 03/2019  . Sleep apnea     Past Surgical History:  Procedure Laterality Date  . HEAD & NECK WOUND REPAIR / CLOSURE    . HERNIA REPAIR    . THYROID SURGERY      History reviewed. No pertinent family history.  Social:  reports that he has quit smoking. He has a 10.00 pack-year smoking history. He has never used smokeless tobacco. He reports previous alcohol use. He reports previous drug use. Drugs: Cocaine  and Marijuana.  Allergies: No Known Allergies  Medications: I have reviewed the patient's current medications.  No results found for this or any previous visit (from the past 48 hour(s)).  No results found.  ROS - all of the below systems have been reviewed with the patient and positives are indicated with bold text General: chills, fever or night sweats Eyes: blurry vision or double vision ENT: epistaxis or sore throat Allergy/Immunology: itchy/watery eyes or nasal congestion Hematologic/Lymphatic: bleeding problems, blood clots or swollen lymph nodes Endocrine: temperature intolerance or unexpected weight changes Breast: new or changing breast lumps or nipple discharge Resp: cough, shortness of breath, or wheezing CV: chest pain or dyspnea on exertion GI: as per HPI GU: dysuria, trouble voiding, or hematuria MSK: joint pain or joint stiffness Neuro: TIA or stroke symptoms Derm: pruritus and skin lesion changes Psych: anxiety and depression  PE Blood pressure (!) 138/93, pulse 86, temperature 98 F (36.7 C), temperature source Oral, resp. rate 17, weight 105.7 kg, SpO2 99 %. Constitutional: NAD; conversant; no deformities Eyes: Moist conjunctiva; no lid lag; anicteric; PERRL Neck: Trachea midline; no thyromegaly Lungs: Normal respiratory effort; no tactile fremitus CV: RRR; no palpable thrills; no pitting edema GI: Abd soft, obese, nt; no palpable hepatosplenomegaly MSK: Normal gait; no clubbing/cyanosis Psychiatric: Appropriate affect; alert and oriented x3 Lymphatic: No palpable cervical or axillary lymphadenopathy  No results found for this or any previous visit (from the past 48 hour(s)).  No results found.  Imaging: n/a  A/P: Todd Buchanan is an 54 y.o. male with  Symptomatic cholelithiasis H/o pancreatitis ckd   To OR for robotic cholecystectomy poss ioc All questions asked and answered ERAS  Leighton Ruff. Redmond Pulling, MD, FACS General, Bariatric, &  Minimally Invasive Surgery [facility], [place]

## 2019-09-26 NOTE — Discharge Instructions (Signed)
[facility] LAPAROSCOPIC SURGERY: POST OP INSTRUCTIONS Always review your discharge instruction sheet given to you by the facility where your surgery was performed. IF YOU HAVE DISABILITY OR FAMILY LEAVE FORMS, YOU MUST BRING THEM TO THE OFFICE FOR PROCESSING.   DO NOT GIVE THEM TO YOUR DOCTOR.  PAIN CONTROL  1. First take acetaminophen (Tylenol) AND/or ibuprofen (Advil) to control your pain after surgery.  Follow directions on package.  Taking acetaminophen (Tylenol) and/or ibuprofen (Advil) regularly after surgery will help to control your pain and lower the amount of prescription pain medication you may need.  You should not take more than 3,000 mg (3 grams) of acetaminophen (Tylenol) in 24 hours.  You should not take ibuprofen (Advil), aleve, motrin, naprosyn or other NSAIDS if you have a history of stomach ulcers or chronic kidney disease.  2. A prescription for pain medication may be given to you upon discharge.  Take your pain medication as prescribed, if you still have uncontrolled pain after taking acetaminophen (Tylenol) or ibuprofen (Advil). 3. Use ice packs to help control pain. 4. If you need a refill on your pain medication, please contact your pharmacy.  They will contact our office to request authorization. Prescriptions will not be filled after 5pm or on week-ends.  HOME MEDICATIONS 5. Take your usually prescribed medications unless otherwise directed.  DIET 6. You should follow a light diet the first few days after arrival home.  Be sure to include lots of fluids daily. Avoid fatty, fried foods.   CONSTIPATION 7. It is common to experience some constipation after surgery and if you are taking pain medication.  Increasing fluid intake and taking a stool softener (such as Colace) will usually help or prevent this problem from occurring.  A mild laxative (Milk of Magnesia or Miralax) should be taken according to package instructions if there are no bowel  movements after 48 hours.  WOUND/INCISION CARE 8. Most patients will experience some swelling and bruising in the area of the incisions.  Ice packs will help.  Swelling and bruising can take several days to resolve.  9. Unless discharge instructions indicate otherwise, follow guidelines below  a. STERI-STRIPS - you may remove your outer bandages 48 hours after surgery, and you may shower at that time.  You have steri-strips (small skin tapes) in place directly over the incision.  These strips should be left on the skin for 7-10 days.   b. DERMABOND/SKIN GLUE - you may shower in 24 hours.  The glue will flake off over the next 2-3 weeks. 10. Any sutures or staples will be removed at the office during your follow-up visit.  ACTIVITIES 11. You may resume regular (light) daily activities beginning the next day--such as daily self-care, walking, climbing stairs--gradually increasing activities as tolerated.  You may have sexual intercourse when it is comfortable.  Refrain from any heavy lifting or straining until approved by your doctor. a. You may drive when you are no longer taking prescription pain medication, you can comfortably wear a seatbelt, and you can safely maneuver your car and apply brakes.  FOLLOW-UP 12. You should see your doctor in the office for a follow-up appointment approximately 2-3 weeks after your surgery.  You should have been given your post-op/follow-up appointment when your surgery was scheduled.  If you did not receive a post-op/follow-up appointment, make sure that you call for this appointment within a day or two after you arrive home to insure a convenient appointment time.  OTHER   INSTRUCTIONS 13.   WHEN TO CALL YOUR DOCTOR: 1. Fever over 101.0 2. Inability to urinate 3. Continued bleeding from incision. 4. Increased pain, redness, or drainage from the incision. 5. Increasing abdominal pain  The clinic staff is available to answer your questions during regular  business hours.  Please don't hesitate to call and ask to speak to one of the nurses for clinical concerns.  If you have a medical emergency, go to the nearest emergency room or call 911.  A surgeon from [facility] is always on call at the hospital. [address] [place], [place], [place]  [address] ? [address], [place], [place]   [address] [phone] ? [phone] ? FAX [phone] Web site: [email or web address]  ........Marland Kitchen   Managing Your Pain After Surgery Without Opioids    Thank you for participating in our program to help patients manage their pain after surgery without opioids. This is part of our effort to provide you with the best care possible, without exposing you or your family to the risk that opioids pose.  What pain can I expect after surgery? You can expect to have some pain after surgery. This is normal. The pain is typically worse the day after surgery, and quickly begins to get better. Many studies have found that many patients are able to manage their pain after surgery with Over-the-Counter (OTC) medications such as Tylenol and Motrin. If you have a condition that does not allow you to take Tylenol or Motrin, notify your surgical team.  How will I manage my pain? The best strategy for controlling your pain after surgery is around the clock pain control with Tylenol (acetaminophen) and Motrin (ibuprofen or Advil). Alternating these medications with each other allows you to maximize your pain control. In addition to Tylenol and Motrin, you can use heating pads or ice packs on your incisions to help reduce your pain.  How will I alternate your regular strength over-the-counter pain medication? You will take a dose of pain medication every three hours. ; Start by taking 650 mg of Tylenol (2 pills of 325 mg) ; 3 hours later take 600 mg of Motrin (3 pills of 200 mg) ; 3 hours after taking the Motrin take 650 mg of Tylenol ; 3 hours  after that take 600 mg of Motrin.   - 1 -  See example - if your first dose of Tylenol is at 12:00 PM   12:00 PM Tylenol 650 mg (2 pills of 325 mg)  3:00 PM Motrin 600 mg (3 pills of 200 mg)  6:00 PM Tylenol 650 mg (2 pills of 325 mg)  9:00 PM Motrin 600 mg (3 pills of 200 mg)  Continue alternating every 3 hours   We recommend that you follow this schedule around-the-clock for at least 3 days after surgery, or until you feel that it is no longer needed. Use the table on the last page of this handout to keep track of the medications you are taking. Important: Do not take more than 3000mg  of Tylenol or 1600mg  of Motrin in a 24-hour period. Do not take ibuprofen/Motrin if you have a history of bleeding stomach ulcers, severe kidney disease, &/or actively taking a blood thinner  What if I still have pain? If you have pain that is not controlled with the over-the-counter pain medications (Tylenol and Motrin or Advil) you might have what we call "breakthrough" pain. You will receive a prescription for a small amount of an opioid  pain medication such as Oxycodone, Tramadol, or Tylenol with Codeine. Use these opioid pills in the first 24 hours after surgery if you have breakthrough pain. Do not take more than 1 pill every 4-6 hours.  If you still have uncontrolled pain after using all opioid pills, don't hesitate to call our staff using the number provided. We will help make sure you are managing your pain in the best way possible, and if necessary, we can provide a prescription for additional pain medication.   Day 1    Time  Name of Medication Number of pills taken  Amount of Acetaminophen  Pain Level   Comments  AM PM       AM PM       AM PM       AM PM       AM PM       AM PM       AM PM       AM PM       Total Daily amount of Acetaminophen Do not take more than  3,000 mg per day      Day 2    Time  Name of Medication Number of pills taken  Amount of Acetaminophen  Pain  Level   Comments  AM PM       AM PM       AM PM       AM PM       AM PM       AM PM       AM PM       AM PM       Total Daily amount of Acetaminophen Do not take more than  3,000 mg per day      Day 3    Time  Name of Medication Number of pills taken  Amount of Acetaminophen  Pain Level   Comments  AM PM       AM PM       AM PM       AM PM          AM PM       AM PM       AM PM       AM PM       Total Daily amount of Acetaminophen Do not take more than  3,000 mg per day      Day 4    Time  Name of Medication Number of pills taken  Amount of Acetaminophen  Pain Level   Comments  AM PM       AM PM       AM PM       AM PM       AM PM       AM PM       AM PM       AM PM       Total Daily amount of Acetaminophen Do not take more than  3,000 mg per day      Day 5    Time  Name of Medication Number of pills taken  Amount of Acetaminophen  Pain Level   Comments  AM PM       AM PM       AM PM       AM PM       AM PM       AM PM         AM PM       AM PM       Total Daily amount of Acetaminophen Do not take more than  3,000 mg per day       Day 6    Time  Name of Medication Number of pills taken  Amount of Acetaminophen  Pain Level  Comments  AM PM       AM PM       AM PM       AM PM       AM PM       AM PM       AM PM       AM PM       Total Daily amount of Acetaminophen Do not take more than  3,000 mg per day      Day 7    Time  Name of Medication Number of pills taken  Amount of Acetaminophen  Pain Level   Comments  AM PM       AM PM       AM PM       AM PM       AM PM       AM PM       AM PM       AM PM       Total Daily amount of Acetaminophen Do not take more than  3,000 mg per day        For additional information about how and where to safely dispose of unused opioid medications - [email or web address]  Disclaimer: This document contains information and/or instructional materials adapted  from [facility] for the typical patient with your condition. It does not replace medical advice from your health care provider because your experience may differ from that of the typical patient. Talk to your health care provider if you have any questions about this document, your condition or your treatment plan. Adapted from [facility]            General Anesthesia, Adult, Care After This sheet gives you information about how to care for yourself after your procedure. Your health care provider may also give you more specific instructions. If you have problems or questions, contact your health care provider. What can I expect after the procedure? After the procedure, the following side effects are common:  Pain or discomfort at the IV site.  Nausea.  Vomiting.  Sore throat.  Trouble concentrating.  Feeling cold or chills.  Weak or tired.  Sleepiness and fatigue.  Soreness and body aches. These side effects can affect parts of the body that were not involved in surgery. Follow these instructions at home:  For at least 24 hours after the procedure:  Have a responsible adult stay with you. It is important to have someone help care for you until you are awake and alert.  Rest as needed.  Do not: ? Participate in activities in which you could fall or become injured. ? Drive. ? Use heavy machinery. ? Drink alcohol. ? Take sleeping pills or medicines that cause drowsiness. ? Make important decisions or sign legal documents. ? Take care of children on your own. Eating and drinking  Follow any instructions from your health care provider about eating or drinking restrictions.  When you feel hungry, start by eating small amounts of foods that are soft and easy to digest (bland), such as toast. Gradually return to your regular diet.  Drink enough fluid to  keep your urine pale yellow.  If you vomit, rehydrate by drinking water, juice, or clear  broth. General instructions  If you have sleep apnea, surgery and certain medicines can increase your risk for breathing problems. Follow instructions from your health care provider about wearing your sleep device: ? Anytime you are sleeping, including during daytime naps. ? While taking prescription pain medicines, sleeping medicines, or medicines that make you drowsy.  Return to your normal activities as told by your health care provider. Ask your health care provider what activities are safe for you.  Take over-the-counter and prescription medicines only as told by your health care provider.  If you smoke, do not smoke without supervision.  Keep all follow-up visits as told by your health care provider. This is important. Contact a health care provider if:  You have nausea or vomiting that does not get better with medicine.  You cannot eat or drink without vomiting.  You have pain that does not get better with medicine.  You are unable to pass urine.  You develop a skin rash.  You have a fever.  You have redness around your IV site that gets worse. Get help right away if:  You have difficulty breathing.  You have chest pain.  You have blood in your urine or stool, or you vomit blood. Summary  After the procedure, it is common to have a sore throat or nausea. It is also common to feel tired.  Have a responsible adult stay with you for the first 24 hours after general anesthesia. It is important to have someone help care for you until you are awake and alert.  When you feel hungry, start by eating small amounts of foods that are soft and easy to digest (bland), such as toast. Gradually return to your regular diet.  Drink enough fluid to keep your urine pale yellow.  Return to your normal activities as told by your health care provider. Ask your health care provider what activities are safe for you. This information is not intended to replace advice given to you by your  health care provider. Make sure you discuss any questions you have with your health care provider. Document Revised: 01/12/2017 Document Reviewed: 08/25/2016 Elsevier Patient Education  2020 [employer].

## 2019-09-26 NOTE — Transfer of Care (Signed)
Immediate Anesthesia Transfer of Care Note  Patient: Todd Buchanan  Procedure(s) Performed: XI ROBOTIC ASSISTED LAPAROSCOPIC CHOLECYSTECTOMY (N/A Abdomen)  Patient Location: PACU  Anesthesia Type:General  Level of Consciousness: awake, alert  and oriented  Airway & Oxygen Therapy: Patient Spontanous Breathing and Patient connected to face mask oxygen  Post-op Assessment: Report given to RN, Post -op Vital signs reviewed and stable and Patient moving all extremities X 4  Post vital signs: Reviewed and stable  Last Vitals:  Vitals Value Taken Time  BP    Temp    Pulse 92 09/26/19 0941  Resp 24 09/26/19 0941  SpO2 98 % 09/26/19 0941  Vitals shown include unvalidated device data.  Last Pain:  Vitals:   09/26/19 0549  TempSrc: Oral  PainSc:          Complications: No complications documented.

## 2019-09-26 NOTE — Anesthesia Postprocedure Evaluation (Signed)
Anesthesia Post Note  Patient: Gunnar Hereford  Procedure(s) Performed: XI ROBOTIC ASSISTED LAPAROSCOPIC CHOLECYSTECTOMY (N/A Abdomen)     Patient location during evaluation: PACU Anesthesia Type: General Level of consciousness: awake and alert Pain management: pain level controlled Vital Signs Assessment: post-procedure vital signs reviewed and stable Respiratory status: spontaneous breathing, nonlabored ventilation, respiratory function stable and patient connected to nasal cannula oxygen Cardiovascular status: blood pressure returned to baseline and stable Postop Assessment: no apparent nausea or vomiting Anesthetic complications: no   No complications documented.  Last Vitals:  Vitals:   09/26/19 1145 09/26/19 1226  BP: (!) 140/99 (!) 160/101  Pulse: 78   Resp:    Temp: 36.4 C 36.4 C  SpO2: 94%     Last Pain:  Vitals:   09/26/19 1226  TempSrc: Oral  PainSc:                  Maeve Debord L Aaryana Betke

## 2019-09-26 NOTE — Op Note (Signed)
Robotic/Xi Cholecystectomy with Firefly immunofluorescence procedure Note   Indications: This patient presents with symptomatic gallbladder disease and will undergo laparoscopic cholecystectomy.  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 The risks, benefits, complications, treatment options, and expected outcomes were discussed with the patient. The possibilities of reaction to medication, pulmonary aspiration, perforation of viscus, bleeding, recurrent infection, finding a normal gallbladder, the need for additional procedures, failure to diagnose a condition, the possible need to convert to an open procedure, and creating a complication requiring transfusion or operation were discussed with the patient. The likelihood of improving the patient's symptoms with return to their baseline status is good.  The patient and/or family concurred with the proposed plan, giving informed consent. The site of surgery properly noted. The patient was taken to Operating Room, identified as Todd Buchanan and the procedure verified as robotic Cholecystectomy. A Time Out was held and the above information confirmed. Antibiotic prophylaxis was administered.    Prior to the induction of general anesthesia, antibiotic prophylaxis was administered. General endotracheal anesthesia was then administered and tolerated well. After the induction, the abdomen was prepped with Chloraprep and draped in the sterile fashion. The patient was positioned in the supine position.   Local anesthetic agent was injected into the skin near the umbilicus and an incision made. We dissected down  to the abdominal fascia with blunt dissection.  The fascia was incised vertically and we entered the peritoneal cavity bluntly.  A pursestring suture of 0-Vicryl was placed around the fascial opening.  A 25mm robotic Hasson cannula was inserted and secured with the stay suture.  Pneumoperitoneum was then created with CO2 and tolerated well without any adverse changes in the patient's vital signs.  The robotic camera was inserted and the abdominal cavity was surveilled.  There is no evidence of injury to surrounding structures.  I then placed three additional 8 mm robotic trochars in a horizontal line in the lower abdomen.  Two trochars were placed on the right side of the abdomen and one in the left lower quadrant all under direct visualization.  I then infiltrated local along the lateral abdominal walls as a tap block.     We positioned the patient in reverse Trendelenburg, tilted slightly to the patient's left.    We then brought in the [employer] I robot and deployed it for upper abdominal surgery from patient's right.  The robotic camera was inserted and the anatomy was targeted.  The robotic arms were attached to the robotic trochars.  A forced bipolar, prograsp, and robotic hook were inserted under direct visualization.   I then scrubbed out and went to the robotic console while the scrub technician stayed at the bedside.    The gallbladder was identified, the fundus grasped and retracted cephalad.  Patient had a very contracted gallbladder.  It was also intrahepatic.  Adhesions were lysed bluntly and with the electrocautery where indicated, taking care not to injure any adjacent organs or viscus. The infundibulum was grasped and retracted laterally, exposing the peritoneum overlying the triangle of Calot. This was then divided and exposed in a blunt fashion.  There was not a lot of working room.  His colon and mesenteric fat was partially  obscuring the view of the infundibulum at times.  Therefore I  requested an Geophysicist/field seismologist.  Dr. Fredricka Bonine join me in the operating room.  She placed a 5 mm trocar in the left lateral abdominal wall under direct visualization.  Then using a suction irrigator catheter she gently pressed down on the colon thus allowing me to have a better visualization of the infundibulum.  Patient had a little bit of woody inflammation in the triangle of Calot.  After some meticulous careful dissection I was able to identify the confluence of the cystic duct with the infundibulum.  Firefly immunofluorescent was turned on several times during the procedure as a secondary confirmation of the location of the cystic duct and common bile duct.  There was no obvious filling defect in the cystic duct or common bile duct.  A critical view of the cystic duct and cystic artery was obtained.  The cystic duct was clearly identified and bluntly dissected circumferentially.  My assistant performed instrument exchanges between the hook and the robotic hemoclips   The cystic duct was then ligated with clips and divided. The cystic artery  which had been identified & dissected free was ligated with clips and divided as well.    The gallbladder was dissected from the liver bed in retrograde fashion with the electrocautery.   The gallbladder was entered.  It was intrahepatic.  There was a little bit of stone spillage.  The stones were evacuated with the suction irrigator catheter. There was some spillage of bile from the gallbladder.  There was 1 stone that spilled.  The hook was removed and the assistant inserted a  Ecco bag & the gallbladder & stone  was removed and placed in an Ecco bag. The liver bed was irrigated and inspected. Hemostasis was achieved with the electrocautery. Copious irrigation was utilized and was repeatedly aspirated until clear. There was no other evidence of spilled gallstones. The robot was undocked.  I scrubbed back in. The gallbladder and Ecco bag were then removed through the umbilical  port site.      the pursestring suture was used to close the umbilical fascia.  I did place an additional interrupted 0 Vicryl at the umbilical fascia using a PMI suture passer   We again inspected the right upper quadrant for hemostasis.  The umbilical closure was inspected and there was no air leak and nothing trapped within the closure. Pneumoperitoneum was released as we removed the trocars.  4-0 Monocryl was used to close the skin.   steri-strips, and clean dressings were applied. The patient was then extubated and brought to the recovery room in stable condition. Instrument, sponge, and needle counts were correct at closure and at the conclusion of the case.    Findings: Probable chronic Cholecystitis with Cholelithiasis; nml critical view; assistant needed to help retract tissue and identify anatomy   Estimated Blood Loss: Minimal         Drains: none         Specimens: Gallbladder           Complications: None; patient tolerated the procedure well.         Disposition: PACU - hemodynamically stable.         Condition: stable  Mary Sella. Andrey Campanile, MD, FACS General, Bariatric, & Minimally Invasive Surgery [facility], [place]

## 2019-09-26 NOTE — Anesthesia Preprocedure Evaluation (Addendum)
Anesthesia Evaluation  Patient identified by MRN, date of birth, ID band Patient awake    Reviewed: Allergy & Precautions, NPO status , Patient's Chart, lab work & pertinent test results  Airway Mallampati: III  TM Distance: >3 FB Neck ROM: Full    Dental no notable dental hx. (+) Teeth Intact   Pulmonary sleep apnea , former smoker,    Pulmonary exam normal breath sounds clear to auscultation       Cardiovascular hypertension, +CHF  Normal cardiovascular exam Rhythm:Regular Rate:Normal     Neuro/Psych negative neurological ROS  negative psych ROS   GI/Hepatic Neg liver ROS, GERD  ,  Endo/Other  negative endocrine ROS  Renal/GU Renal InsufficiencyRenal disease (Cr 1.5, K 3.9)  negative genitourinary   Musculoskeletal negative musculoskeletal ROS (+)   Abdominal   Peds  Hematology negative hematology ROS (+)   Anesthesia Other Findings   Reproductive/Obstetrics                            Anesthesia Physical Anesthesia Plan  ASA: III  Anesthesia Plan: General   Post-op Pain Management:    Induction: Intravenous  PONV Risk Score and Plan: 2 and Midazolam, Dexamethasone and Ondansetron  Airway Management Planned: Oral ETT  Additional Equipment:   Intra-op Plan:   Post-operative Plan: Extubation in OR  Informed Consent: I have reviewed the patients History and Physical, chart, labs and discussed the procedure including the risks, benefits and alternatives for the proposed anesthesia with the patient or authorized representative who has indicated his/her understanding and acceptance.     Dental advisory given  Plan Discussed with: CRNA  Anesthesia Plan Comments:         Anesthesia Quick Evaluation

## 2019-09-30 LAB — SURGICAL PATHOLOGY

## 2021-08-10 IMAGING — DX DG ABDOMEN 1V
2 series · 2 of 2 positions shown · non-contrast
Comparison: None.

CLINICAL DATA: Abdominal distension.

EXAM:
ABDOMEN - 1 VIEW

[abdomen kub (1 of 2)]
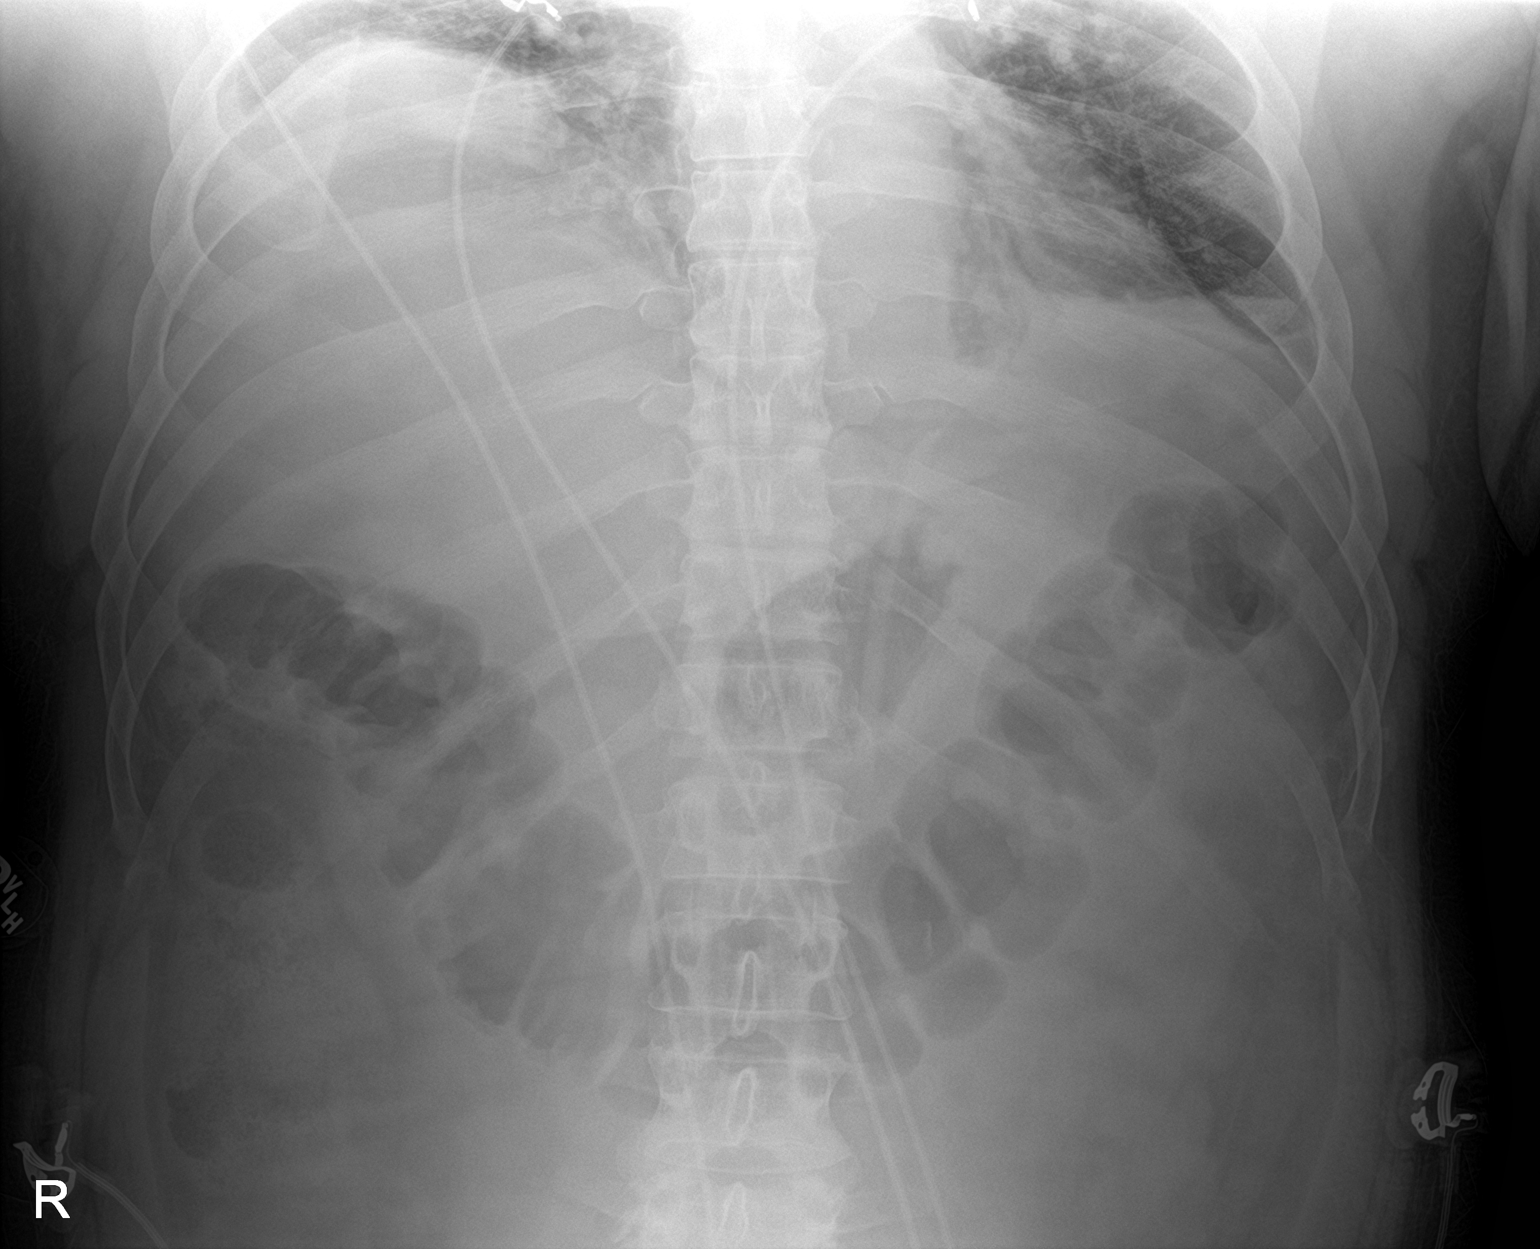

[abdomen kub (2 of 2)]
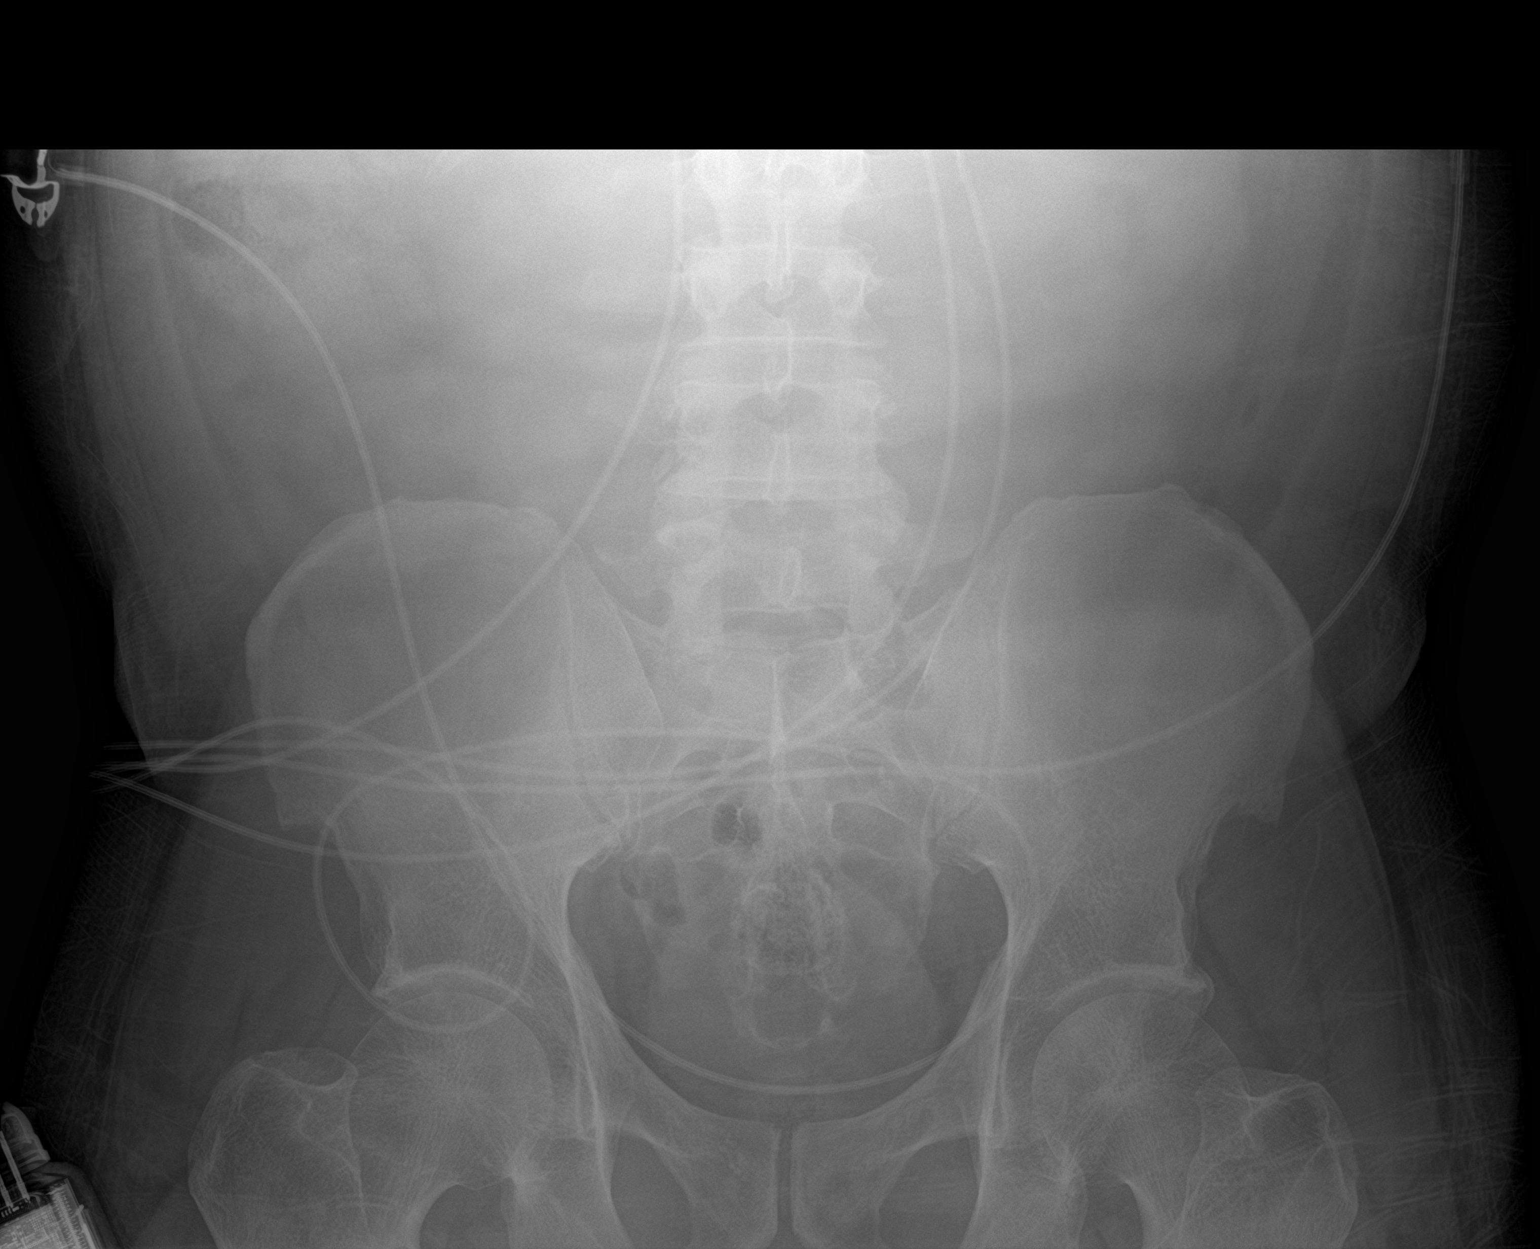

[2 of 2 positions shown; findings below may reference images not displayed]

FINDINGS: The bowel gas pattern is normal. No radio-opaque calculi or other
significant radiographic abnormality are seen.
IMPRESSION: No evidence of bowel obstruction or ileus.

## 2021-10-21 IMAGING — US US ABDOMEN LIMITED W/ ELASTOGRAPHY
1 series · 12 of 25 positions shown · non-contrast
Comparison: MRI of 04/03/2019.

CLINICAL DATA: History of alcohol abuse.

EXAM:
US ABDOMEN LIMITED - RIGHT UPPER QUADRANT
ULTRASOUND HEPATIC ELASTOGRAPHY
TECHNIQUE: Sonography of the right upper quadrant was performed. In addition,
ultrasound elastography evaluation of the liver was performed. A
region of interest was placed within the right lobe of the liver.
Following application of a compressive sonographic pulse, tissue
compressibility was assessed. Multiple assessments were performed at
the selected site. Median tissue compressibility was determined.
Previously, hepatic stiffness was assessed by shear wave velocity.
Based on recently published Society of [employer] in Ultrasound
consensus article, reporting is now recommended to be performed in
the SI units of pressure (kiloPascals) representing hepatic
stiffness/elasticity. The obtained result is compared to the
published reference standards. (cACLD = compensated Advanced Chronic
Liver Disease)

[Series 1: us abdomen limited w/ elastography · 0.19mm/px · 12 of 47 slices shown]
[im 2/47]
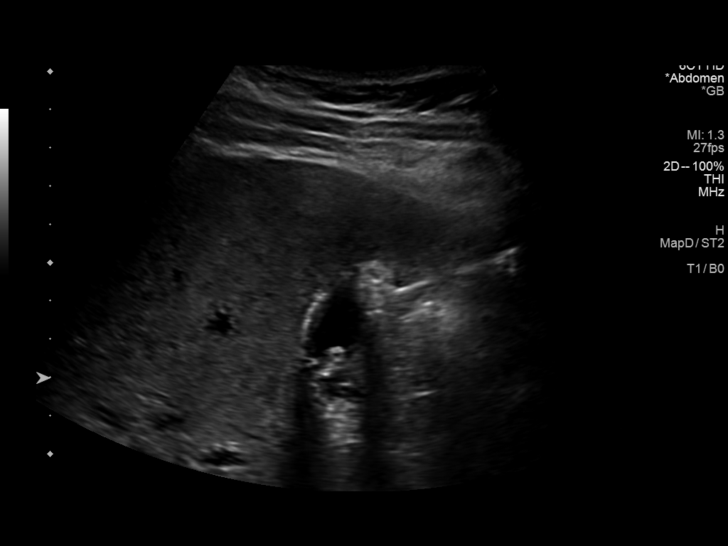
[im 6/47]
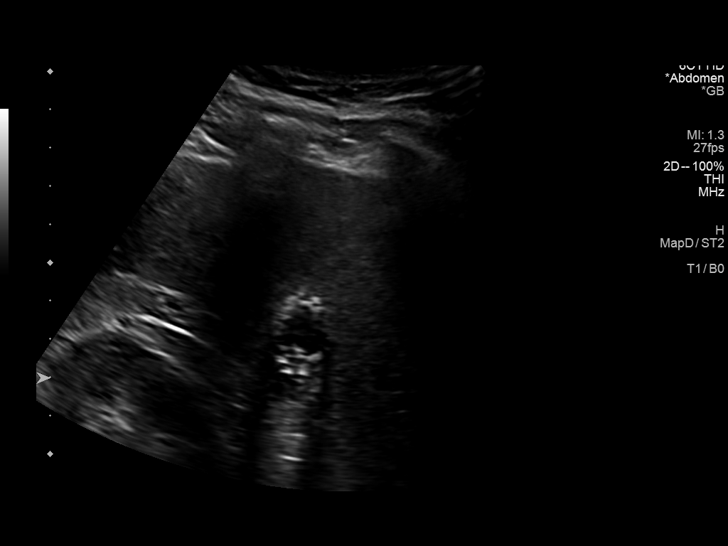
[im 10/47]
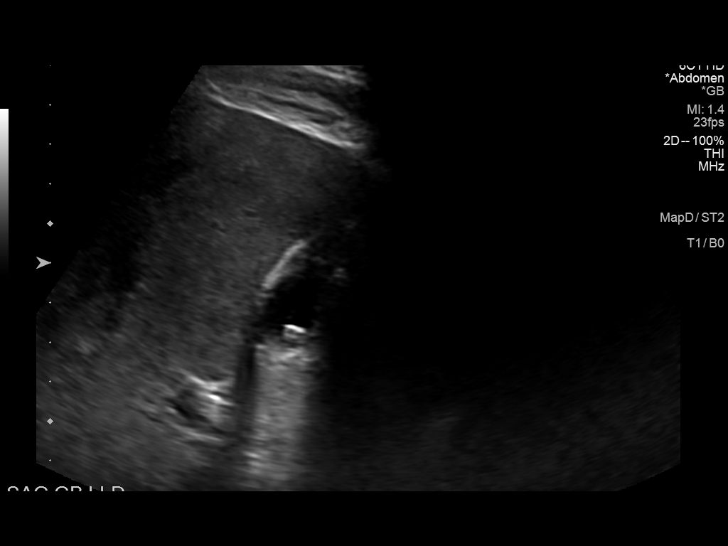
[im 14/47]
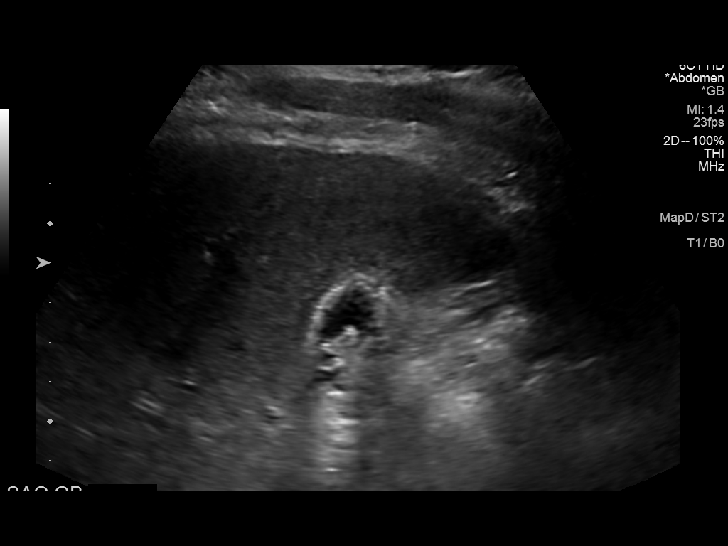
[im 18/47]
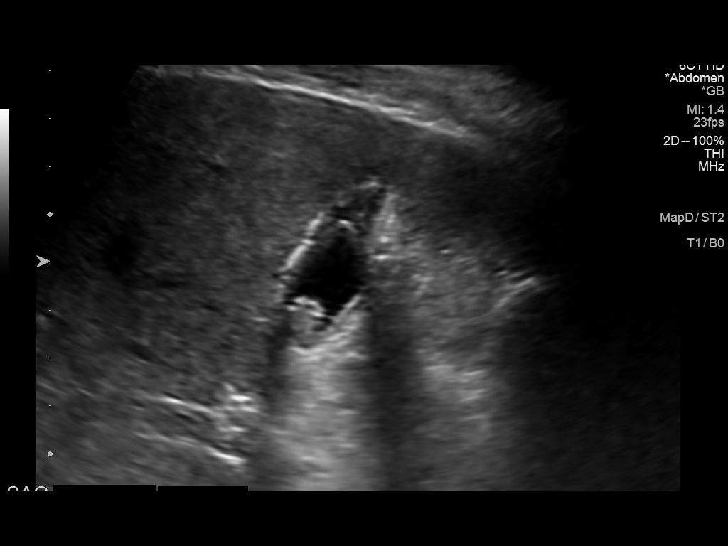
[im 22/47]
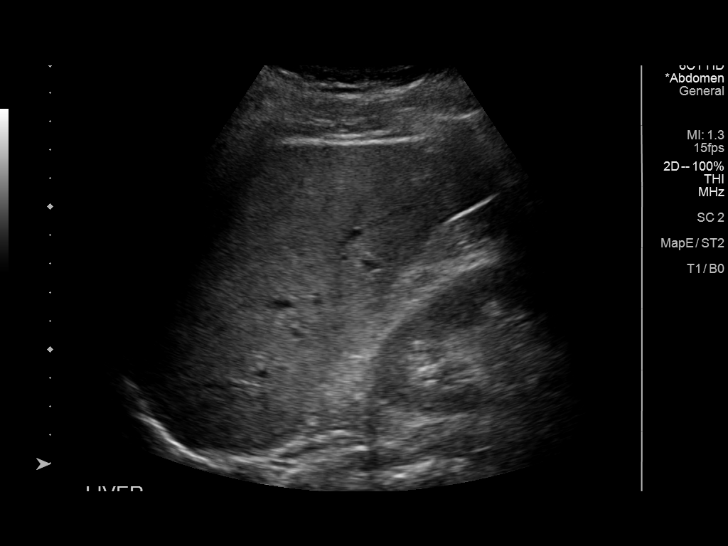
[im 25/47]
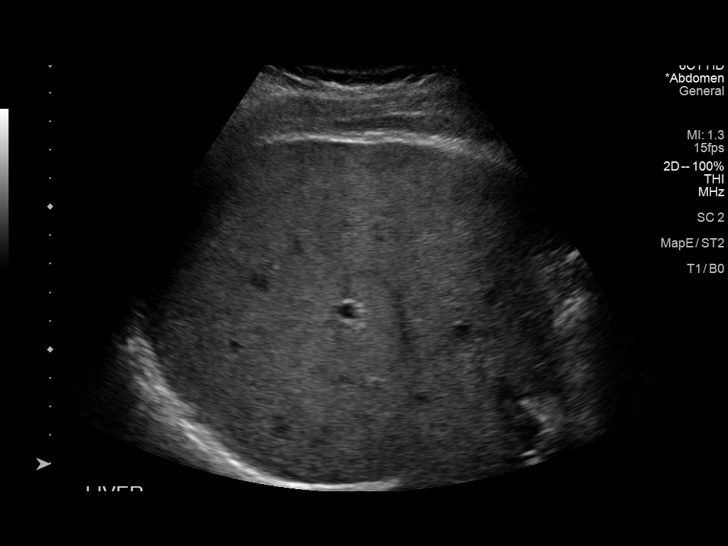
[im 29/47]
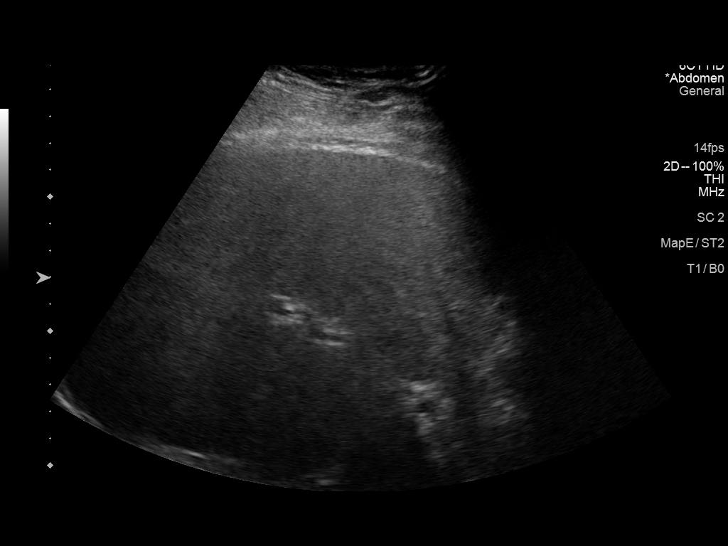
[im 33/47]
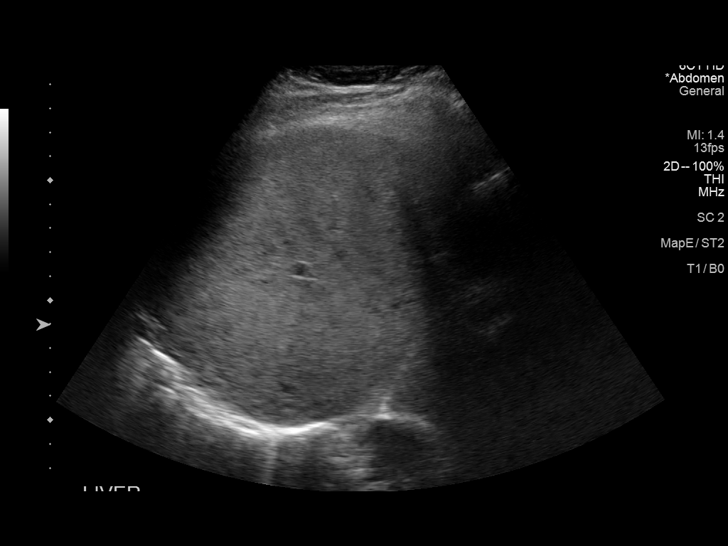
[im 37/47]
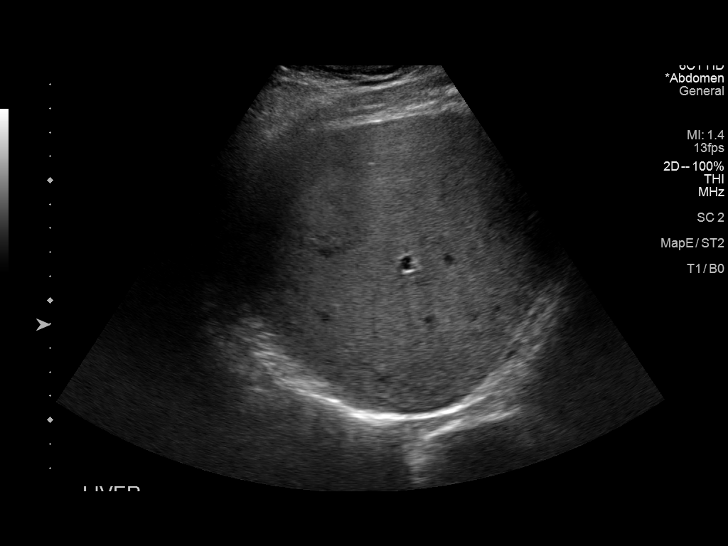
[im 41/47]
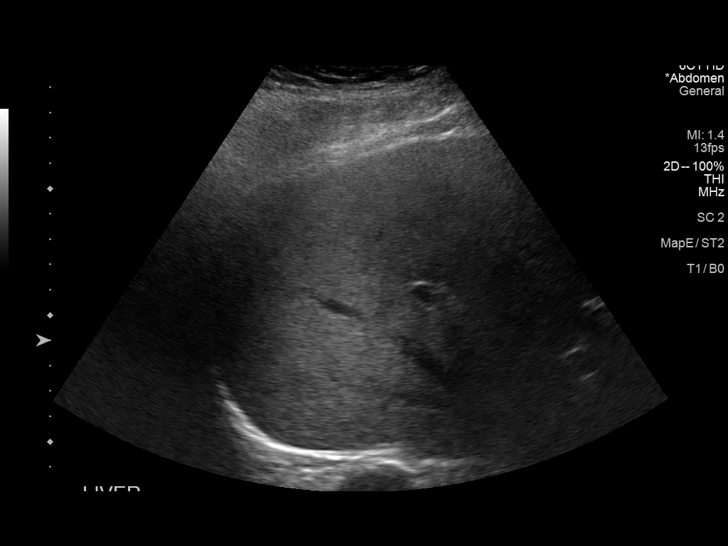
[im 45/47]
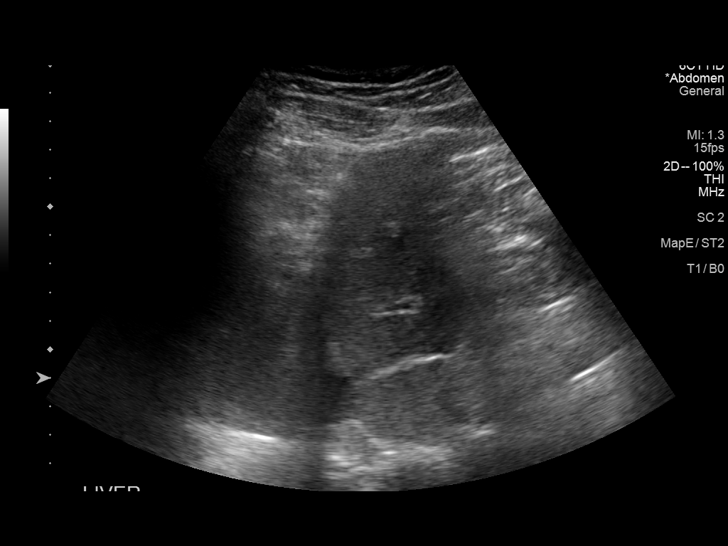

[12 of 25 positions shown; findings below may reference images not displayed]

FINDINGS: ULTRASOUND ABDOMEN LIMITED RIGHT UPPER QUADRANT

Gallbladder:

Contracted gallbladder. 6 mm stone with no wall thickening or
pericholecystic fluid. Sonographic Murphy's sign was not elicited.

Common bile duct:

Diameter: Normal, 3 mm.

Liver:

No focal lesion identified. Within normal limits in parenchymal
echogenicity. Portal vein is patent on color Doppler imaging with
normal direction of blood flow towards the liver.

ULTRASOUND HEPATIC ELASTOGRAPHY

Device: Siemens Helix VTQ

Patient position: Oblique

Transducer [id number]

Number of measurements: 10

Hepatic segment:  8

Median kPa:

IQR:

IQR/Median kPa ratio:

Data quality:  Good

Diagnostic category: < or = 9 kPa: in the absence of other known
clinical signs, rules out cACLD
IMPRESSION: ULTRASOUND RUQ:

Cholelithiasis with a contracted gallbladder. This could represent a
component of chronic cholecystitis.

ULTRASOUND HEPATIC ELASTOGRAPHY:

Median kPa:

Diagnostic category:  < or = 5 kPa: high probability of being normal

The use of hepatic elastography is applicable to patients with viral
hepatitis and non-alcoholic fatty liver disease. At this time, there
is insufficient data for the referenced cut-off values and use in
other causes of liver disease, including alcoholic liver disease.
Patients, however, may be assessed by elastography and serve as
their own reference standard/baseline.

In patients with non-alcoholic liver disease, the values suggesting
compensated advanced chronic liver disease (cACLD) may be lower, and
patients may need additional testing with elasticity results of [DATE]
kPa.

Please note that abnormal hepatic elasticity and shear wave
velocities may also be identified in clinical settings other than
with hepatic fibrosis, such as: acute hepatitis, elevated right
heart and central venous pressures including use of beta blockers,
Toral disease ([place]), infiltrative processes such as
mastocytosis/amyloidosis/infiltrative tumor/lymphoma, extrahepatic
cholestasis, with hyperemia in the post-prandial state, and with
liver transplantation. Correlation with patient history, laboratory
data, and clinical condition recommended.

Diagnostic Categories:

< or =5 kPa: high probability of being normal

< or =9 kPa: in the absence of other known clinical signs, rules [DATE] kPa and ?13 kPa: suggestive of cACLD, but needs further testing

>13 kPa: highly suggestive of cACLD

> or =17 kPa: highly suggestive of cACLD with an increased
probability of clinically significant portal hypertension
